# Patient Record
Sex: Female | Born: 1985
Health system: Southern US, Community
[De-identification: ages and names within clinical notes are randomized; demographics above are authoritative.]

## PROBLEM LIST (undated history)

## (undated) ENCOUNTER — Inpatient Hospital Stay: Payer: Self-pay

## (undated) DIAGNOSIS — F419 Anxiety disorder, unspecified: Secondary | ICD-10-CM

## (undated) DIAGNOSIS — E282 Polycystic ovarian syndrome: Secondary | ICD-10-CM

## (undated) HISTORY — DX: Anxiety disorder, unspecified: F41.9

## (undated) HISTORY — DX: Polycystic ovarian syndrome: E28.2

## (undated) HISTORY — PX: MOUTH SURGERY: SHX715

---
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
## 2007-06-05 DIAGNOSIS — E669 Obesity, unspecified: Secondary | ICD-10-CM | POA: Insufficient documentation

## 2007-12-26 ENCOUNTER — Emergency Department: Payer: Self-pay | Admitting: Emergency Medicine

## 2008-09-30 DIAGNOSIS — L709 Acne, unspecified: Secondary | ICD-10-CM | POA: Insufficient documentation

## 2009-01-06 DIAGNOSIS — J309 Allergic rhinitis, unspecified: Secondary | ICD-10-CM | POA: Insufficient documentation

## 2009-03-19 ENCOUNTER — Ambulatory Visit: Payer: Self-pay | Admitting: Otolaryngology

## 2010-04-11 DIAGNOSIS — F419 Anxiety disorder, unspecified: Secondary | ICD-10-CM

## 2010-04-11 HISTORY — DX: Anxiety disorder, unspecified: F41.9

## 2011-03-25 ENCOUNTER — Ambulatory Visit: Payer: Self-pay | Admitting: Family Medicine

## 2011-04-08 ENCOUNTER — Ambulatory Visit: Payer: Self-pay | Admitting: General Surgery

## 2011-04-12 HISTORY — PX: LAPAROSCOPIC CHOLECYSTECTOMY: SUR755

## 2011-04-13 LAB — PATHOLOGY REPORT

## 2012-04-11 HISTORY — PX: GALLBLADDER SURGERY: SHX652

## 2014-10-16 ENCOUNTER — Encounter: Payer: Self-pay | Admitting: Family Medicine

## 2014-10-16 ENCOUNTER — Ambulatory Visit (INDEPENDENT_AMBULATORY_CARE_PROVIDER_SITE_OTHER): Payer: BC Managed Care – PPO | Admitting: Family Medicine

## 2014-10-16 VITALS — BP 118/80 | HR 64 | Temp 98.5°F | Resp 16 | Wt 234.4 lb

## 2014-10-16 DIAGNOSIS — R519 Headache, unspecified: Secondary | ICD-10-CM | POA: Insufficient documentation

## 2014-10-16 DIAGNOSIS — B078 Other viral warts: Secondary | ICD-10-CM | POA: Insufficient documentation

## 2014-10-16 DIAGNOSIS — L980 Pyogenic granuloma: Secondary | ICD-10-CM | POA: Insufficient documentation

## 2014-10-16 DIAGNOSIS — F4320 Adjustment disorder, unspecified: Secondary | ICD-10-CM | POA: Insufficient documentation

## 2014-10-16 DIAGNOSIS — F4541 Pain disorder exclusively related to psychological factors: Secondary | ICD-10-CM | POA: Insufficient documentation

## 2014-10-16 DIAGNOSIS — F411 Generalized anxiety disorder: Secondary | ICD-10-CM | POA: Diagnosis not present

## 2014-10-16 DIAGNOSIS — Z8619 Personal history of other infectious and parasitic diseases: Secondary | ICD-10-CM | POA: Insufficient documentation

## 2014-10-16 DIAGNOSIS — R51 Headache: Secondary | ICD-10-CM

## 2014-10-16 DIAGNOSIS — F419 Anxiety disorder, unspecified: Secondary | ICD-10-CM | POA: Insufficient documentation

## 2014-10-16 DIAGNOSIS — F4542 Pain disorder with related psychological factors: Secondary | ICD-10-CM | POA: Insufficient documentation

## 2014-10-16 MED ORDER — SERTRALINE HCL 50 MG PO TABS: 50.0000 mg | ORAL_TABLET | Freq: Every day | ORAL | Status: DC

## 2014-10-16 MED ORDER — CLONAZEPAM 0.5 MG PO TABS: 0.5000 mg | ORAL_TABLET | Freq: Two times a day (BID) | ORAL | Status: DC | PRN

## 2014-10-16 NOTE — Patient Instructions (Signed)
Call me if you can't tolerate one of the medications.

## 2014-10-16 NOTE — Progress Notes (Signed)
Subjective:     Patient ID: Ashlee Wilson, female   DOB: 05/02/1985, 29 y.o.   MRN: [id number]  HPI  Chief Complaint  Patient presents with  . Anxiety    Patient presents for follow up. LOV 04/15/13 Anxiety Disorder: Feeling well since Christmas holiday. No medications now CESD-R 2/60 and GAD-7 score 0/21. Reasses in 6-12 months. Patient is back in office today with concerns of anxiety for the past several months she describes anxiety as an expectant dread and when  she has attacks she states she loses her breath. Anxiety triggered when driving car and fearful of future events or whats to come.   States she has been off medications for a while and had been coping with anxiety with regular gym visits or by going to sleep. States she is getting married in November and has been experiencing anxiety episodes daily. Wishes to get back on medication. Did not feel buspirone or Wellbutrin worked well for her in the past. Reports jaw clinching with Paxil. Denies depression.   Review of Systems  Constitutional:       Not self medicating with tobacco, alcohol, or recreational drugs.       Objective:   Physical Exam  Constitutional: She appears well-developed and well-nourished. No distress.  Psychiatric: She has a normal mood and affect.       Assessment:    1. Generalized anxiety disorder - sertraline (ZOLOFT) 50 MG tablet; Take 1 tablet (50 mg total) by mouth daily. Take 1/2 pill for the first 5 days  Dispense: 30 tablet; Refill: 3 - clonazePAM (KLONOPIN) 0.5 MG tablet; Take 1 tablet (0.5 mg total) by mouth 2 (two) times daily as needed for anxiety.  Dispense: 20 tablet; Refill: 1    Plan:    F/u in 2 weeks.

## 2014-10-30 ENCOUNTER — Ambulatory Visit (INDEPENDENT_AMBULATORY_CARE_PROVIDER_SITE_OTHER): Payer: BC Managed Care – PPO | Admitting: Family Medicine

## 2014-10-30 ENCOUNTER — Encounter: Payer: Self-pay | Admitting: Family Medicine

## 2014-10-30 VITALS — BP 106/78 | HR 74 | Temp 98.4°F | Resp 16 | Ht 64.0 in | Wt 235.8 lb

## 2014-10-30 DIAGNOSIS — F411 Generalized anxiety disorder: Secondary | ICD-10-CM

## 2014-10-30 NOTE — Progress Notes (Signed)
Subjective:     Patient ID: Ashlee Wilson, female   DOB: 02-08-1986, 29 y.o.   MRN: [id number]  HPI  Chief Complaint  Patient presents with  . Anxiety    patient presents today for follow up from 10/16/14. Patient was prescribed Zoloft  and Klonopin 0.5mg , patient reports symptoms have improved and she is tolerating medication well only complaint patient states that she is taking medication in AM and around 4-5pm she starts to feel anxious again.   Will take clonazepam in the evening to help sleep. States fiance and friends all say that she is less anxious.   Review of Systems  Constitutional: Activity change: continues wth regular gym exercise with a personal trainer.        Objective:   Physical Exam  Constitutional: She appears well-developed and well-nourished. No distress.  Psychiatric: She has a normal mood and affect.       Assessment:    1. Generalized anxiety disorder-improved     Plan:    phone f/u in two weeks. Discussed pushing up time of taking sertraline to later in the day. If not effective improving late day sx will increase the dose.

## 2014-10-30 NOTE — Patient Instructions (Addendum)
Phone f/u in two weeks. We will decide on dose of sertraline at that time. Continue with your lifestyle changes!

## 2015-04-08 ENCOUNTER — Encounter: Payer: Self-pay | Admitting: Family Medicine

## 2015-04-08 ENCOUNTER — Ambulatory Visit (INDEPENDENT_AMBULATORY_CARE_PROVIDER_SITE_OTHER): Payer: BC Managed Care – PPO | Admitting: Family Medicine

## 2015-04-08 VITALS — BP 112/78 | HR 108 | Temp 97.5°F | Resp 18 | Wt 242.0 lb

## 2015-04-08 DIAGNOSIS — L0591 Pilonidal cyst without abscess: Secondary | ICD-10-CM | POA: Diagnosis not present

## 2015-04-08 MED ORDER — CEFDINIR 300 MG PO CAPS: 300.0000 mg | ORAL_CAPSULE | Freq: Two times a day (BID) | ORAL | Status: DC

## 2015-04-08 NOTE — Progress Notes (Signed)
Subjective:     Patient ID: Ashlee Wilson, female   DOB: [birth year], 29 y.o.   MRN: [id number]  HPI  Chief Complaint  Patient presents with  . Tailbone Pain    No known injury, Started christmas eve then gotten worse overnight. Thinks it is a cyst. Does not see anything on skin but can feel something under the skin.  Denies injury or hx of MRSA. No drainage reported. Hurts with sitting. Accompanied by her husband today. Standing on presentation. Reports her menses are not due until 1/6 and they have been trying to get pregnant.   Review of Systems  Constitutional: Negative for fever and chills.       Objective:   Physical Exam  Constitutional: She appears well-developed and well-nourished. She appears distressed (moderate pain).  Skin:  Proximal buttock cleft with 2 cm area of swelling, mild erythema and tenderness. No pointing or drainage. Exquisitely tender to the touch.       Assessment:    1. Infected pilonidal cyst - cefdinir (OMNICEF) 300 MG capsule; Take 1 capsule (300 mg total) by mouth 2 (two) times daily.  Dispense: 14 capsule; Refill: 0    Plan:    Discussed bathtub soaks and use of Tylenol up to 3000 mg./day. Return in 2 days if not improving.

## 2015-04-08 NOTE — Patient Instructions (Signed)
Discussed use of bathtub soaks daily and tylenol up to 3000 mg./day. The area should be improving or coming to a head in the next 48 hours.

## 2015-04-10 ENCOUNTER — Ambulatory Visit (INDEPENDENT_AMBULATORY_CARE_PROVIDER_SITE_OTHER): Payer: BC Managed Care – PPO | Admitting: Family Medicine

## 2015-04-10 ENCOUNTER — Encounter: Payer: Self-pay | Admitting: Family Medicine

## 2015-04-10 VITALS — BP 128/74 | HR 84 | Temp 98.0°F | Resp 16

## 2015-04-10 DIAGNOSIS — L0591 Pilonidal cyst without abscess: Secondary | ICD-10-CM

## 2015-04-10 NOTE — Patient Instructions (Signed)
Continue bathtub soaks and antibiotic. If increased swelling, pain, or drainage report to Urgent Care over the holiday weekend.

## 2015-04-10 NOTE — Progress Notes (Signed)
Subjective:     Patient ID: Ashlee Wilson, female   DOB: [birth year], 29 y.o.   MRN: [id number]  HPI  Chief Complaint  Patient presents with  . Cyst    Pilonidal cyst recheck from 2 days ago. Pt is not much better, but is sitting today.  Denies drainage and has been getting relief from Tylenol. Soaking for 15 minutes in the bathtub daily. Adamantly does not wish I & D today.   Review of Systems  Constitutional: Negative for fever and chills.       Objective:   Physical Exam  Constitutional: She appears well-developed and well-nourished. No distress.  Skin:  Decreased swelling in her proximal buttock cleft. No pointing or drainage. Continues to have exquisite tenderness and induration in her buttock parallel to her buttock cleft.       Assessment:    1. Infected pilonidal cyst: mild improvement    Plan:    Will continue abx and soaks. Will report to Urgent Care if increased pain and swelling over the holiday weekend.

## 2015-04-17 ENCOUNTER — Encounter: Payer: Self-pay | Admitting: Family Medicine

## 2015-04-17 ENCOUNTER — Ambulatory Visit (INDEPENDENT_AMBULATORY_CARE_PROVIDER_SITE_OTHER): Payer: BC Managed Care – PPO | Admitting: Family Medicine

## 2015-04-17 VITALS — BP 112/84 | HR 78 | Temp 98.4°F | Resp 16 | Wt 246.4 lb

## 2015-04-17 DIAGNOSIS — N912 Amenorrhea, unspecified: Secondary | ICD-10-CM

## 2015-04-17 DIAGNOSIS — Z331 Pregnant state, incidental: Secondary | ICD-10-CM | POA: Diagnosis not present

## 2015-04-17 DIAGNOSIS — Z3490 Encounter for supervision of normal pregnancy, unspecified, unspecified trimester: Secondary | ICD-10-CM

## 2015-04-17 LAB — POCT URINE PREGNANCY: Preg Test, Ur: POSITIVE — AB

## 2015-04-17 NOTE — Progress Notes (Signed)
Subjective:     Patient ID: Ashlee Wilson, female   DOB: 1986/01/12, 30 y.o.   MRN: [id number]  HPI  Chief Complaint  Patient presents with  . Possible Pregnancy    Patient comes in office to confrim pregnancy, patient [place] and that she had not been using any form of protection. She states that they planned pregnancy and wants to continue [facility]  No longer on medication. This is her first pregnancy. First day of LMP 12/9. Accompanied by her husband.   Review of Systems  Skin:       States pilonidal cyst has spontaneously drained clear fluid from her prior o.v.       Objective:   Physical Exam  Constitutional: She appears well-developed and well-nourished. No distress.       Assessment:    1. Absence of menstruation - POCT urine pregnancy  2. Pregnancy: will self refer to [facility]. Has started Prenatal vitamins.    Plan:    [facility].

## 2015-04-17 NOTE — Patient Instructions (Signed)
Continue pre-natal vitamins. Avoid medications as directed by your gyn.

## 2015-05-25 ENCOUNTER — Encounter: Payer: Self-pay | Admitting: Family Medicine

## 2015-05-25 ENCOUNTER — Ambulatory Visit (INDEPENDENT_AMBULATORY_CARE_PROVIDER_SITE_OTHER): Payer: BC Managed Care – PPO | Admitting: Family Medicine

## 2015-05-25 VITALS — BP 110/58 | HR 98 | Temp 98.7°F | Resp 16 | Wt 260.0 lb

## 2015-05-25 DIAGNOSIS — Z20828 Contact with and (suspected) exposure to other viral communicable diseases: Secondary | ICD-10-CM

## 2015-05-25 DIAGNOSIS — J069 Acute upper respiratory infection, unspecified: Secondary | ICD-10-CM

## 2015-05-25 LAB — POC INFLUENZA A&B (BINAX/QUICKVUE)
INFLUENZA A, POC: NEGATIVE
Influenza B, POC: NEGATIVE

## 2015-05-25 NOTE — Patient Instructions (Signed)
Products like Claritin, Delsym, and saline nasal spray are generally considered safe in pregnancy. Check with your gyn regarding other products.

## 2015-05-25 NOTE — Progress Notes (Signed)
Subjective:     Patient ID: Ashlee Wilson, female   DOB: 1985-08-12, 30 y.o.   MRN: [id number]  HPI  Chief Complaint  Patient presents with  . URI    Patient reports that she has cough, nasal congestion, low grade fever, and sore throat. 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   Reports onset of sx 2 days ago. She is currently pregnant.   Review of Systems     Objective:   Physical Exam  Constitutional: She appears well-developed and well-nourished. No distress.  Ears: T.M's intact without inflammation Sinuses: non-tender Throat: tonsils absent Neck: no cervical adenopathy Lungs: clear     Assessment:    1. Upper respiratory infection  2. Exposure to influenza - POC Influenza A&B    Plan:    Discussed symptomatic rx. She will call her ob-gyn and see which preparations they approve.

## 2015-07-03 LAB — OB RESULTS CONSOLE RUBELLA ANTIBODY, IGM: Rubella: IMMUNE

## 2015-07-03 LAB — OB RESULTS CONSOLE VARICELLA ZOSTER ANTIBODY, IGG: Varicella: IMMUNE

## 2015-07-03 LAB — OB RESULTS CONSOLE HEPATITIS B SURFACE ANTIGEN: Hepatitis B Surface Ag: NEGATIVE

## 2015-08-01 ENCOUNTER — Ambulatory Visit (INDEPENDENT_AMBULATORY_CARE_PROVIDER_SITE_OTHER): Payer: BC Managed Care – PPO | Admitting: Family Medicine

## 2015-08-01 ENCOUNTER — Encounter: Payer: Self-pay | Admitting: Family Medicine

## 2015-08-01 VITALS — BP 110/78 | HR 98 | Temp 98.3°F | Resp 16 | Wt 271.0 lb

## 2015-08-01 DIAGNOSIS — J301 Allergic rhinitis due to pollen: Secondary | ICD-10-CM

## 2015-08-01 DIAGNOSIS — H6981 Other specified disorders of Eustachian tube, right ear: Secondary | ICD-10-CM | POA: Diagnosis not present

## 2015-08-01 NOTE — Progress Notes (Signed)
Patient ID: Ashlee Wilson, female   DOB: 02-24-1986, 30 y.o.   MRN: [id number]       Patient: Ashlee Wilson Female    DOB: 02-24-1986   30 y.o.   MRN: [id number] Visit Date: 08/01/2015  Today's Provider: Dortha Kernennis Chrismon, PA   Chief Complaint  Patient presents with  . Otitis Media   Subjective:    Sore Throat  Associated symptoms include ear pain.  Ear Pain: Patient presents with right ear pain.  Symptoms include throat itchy. Symptoms began 4 days ago and are gradually worsening since that time. Patient denies fever and nonproductive cough. Ear history:  previous ear infections as a child.  Patient Active Problem List   Diagnosis Date Noted  . Anxiety disorder 10/16/2014  . Allergic rhinitis 01/06/2009  . Acne 09/30/2008  . Adiposity 06/05/2007   Past Surgical History  Procedure Laterality Date  . Gallbladder surgery  2014   Family History  Problem Relation Age of Onset  . Asthma Mother   . Hyperlipidemia Father    Allergies  Allergen Reactions  . Benadryl [Diphenhydramine]   . Oxycodone-Acetaminophen   . Paroxetine   . Penicillins    Previous Medications   No medications on file    Review of Systems  Constitutional: Negative.   HENT: Positive for ear pain and postnasal drip. Negative for rhinorrhea, sneezing and sore throat.   Eyes: Negative.   Respiratory: Negative.   Cardiovascular: Negative.   Gastrointestinal: Negative.     Social History  Substance Use Topics  . Smoking status: Never Smoker   . Smokeless tobacco: Not on file  . Alcohol Use: No   Objective:   LMP 03/20/2015 ([redacted] weeks pregnant). BP 110/78 mmHg  Pulse 98  Temp(Src) 98.3 F (36.8 C)  Resp 16  Wt 271 lb (122.925 kg)  SpO2 98%    Physical Exam  Constitutional: She is oriented to person, place, and time. She appears well-developed and well-nourished. No distress.  HENT:  Head: Normocephalic and atraumatic.  Right Ear: Hearing and external ear normal.  Left Ear:  Hearing and external ear normal.  Mouth/Throat: Oropharynx is clear and moist.  Slightly reddened nasal membranes with clear drainage.  Eyes: Conjunctivae and lids are normal. Right eye exhibits no discharge. Left eye exhibits no discharge. No scleral icterus.  Cardiovascular: Normal rate and regular rhythm.   Pulmonary/Chest: Effort normal and breath sounds normal. No respiratory distress.  Abdominal: Soft. Bowel sounds are normal.  Musculoskeletal: Normal range of motion.  Lymphadenopathy:    She has no cervical adenopathy.  Neurological: She is alert and oriented to person, place, and time.  Skin: Skin is intact. No lesion and no rash noted.  Psychiatric: She has a normal mood and affect. Her speech is normal and behavior is normal. Thought content normal.      Assessment & Plan:     1. Eustachian tube dysfunction, right Recent onset with itching in posterior throat on the right. No drainage from ear and no fever. No sign of infection on exam. May use Zyrtec qd (Cat. B for pregnancy). Recheck prn.  2. Allergic rhinitis due to pollen Some irritation to ear and nose. May use Zyrtec and saline nasal spray. Don't recommend steroid nasal sprays being [redacted] weeks pregnant. Recheck prn.       Dortha Kernennis Chrismon, PA  [facility]

## 2015-09-30 LAB — OB RESULTS CONSOLE RPR: RPR: NONREACTIVE

## 2015-09-30 LAB — OB RESULTS CONSOLE HIV ANTIBODY (ROUTINE TESTING): HIV: NONREACTIVE

## 2015-11-30 LAB — OB RESULTS CONSOLE GBS: STREP GROUP B AG: NEGATIVE

## 2015-12-15 ENCOUNTER — Encounter: Payer: Self-pay | Admitting: *Deleted

## 2015-12-15 ENCOUNTER — Inpatient Hospital Stay
Admission: EM | Admit: 2015-12-15 | Discharge: 2015-12-15 | Disposition: A | Discharge status: Home / Self Care | Payer: BC Managed Care – PPO | Attending: Obstetrics & Gynecology | Admitting: Obstetrics & Gynecology

## 2015-12-15 DIAGNOSIS — Z3A38 38 weeks gestation of pregnancy: Secondary | ICD-10-CM | POA: Diagnosis not present

## 2015-12-15 NOTE — Discharge Instructions (Signed)
Call provider or return to birthplace with: ° °1. Strong regular contractions every 5 minutes. °2. Leaking of fluid from your vagina °3. Vaginal bleeding: Bright red or heavy like a period °4. Decreased Fetal movement ° °

## 2015-12-15 NOTE — Discharge Summary (Signed)
Physician Discharge Summary  Patient ID: Ashlee Wilson MRN: [id number] DOB/AGE: November 28, 1985 30 y.o.  Admit date: 12/15/2015 Discharge date: 12/15/2015  Admission Diagnoses: Pt sent from clinic this evening for prolonged monitoring due to decelerations seen on NST. Pt admits positive fetal movement. Pt denies contractions, LOF, VB  Discharge Diagnoses:  Active Problems:   Labor and delivery indication for care   IUP with Reactive NST  Discharged Condition: good  Hospital Course: Pt admitted for observation and placed on monitors  Consults: None  Significant Diagnostic Studies: None  Treatments: None  Discharge Exam: Height 5\' 4"  (1.626 m), weight 129.7 kg (286 lb), last menstrual period 03/20/2015. General appearance: alert, cooperative, appears stated age and no distress Resp: clear to auscultation bilaterally Cardio: regular rate and rhythm GI: gravid, mild to palpation Cervix: deferred  Toco: occasional contraction Fetal Well Being: 130 bpm baseline, moderate variability, + accelerations, - decelerations Category I tracing  Disposition: Discharge to home  Discharge Instructions    Discharge activity:  No Restrictions    Complete by:  As directed   Discharge diet:  No restrictions    Complete by:  As directed   Fetal Kick Count:  Lie on our left side for one hour after a meal, and count the number of times your baby kicks.  If it is less than 5 times, get up, move around and drink some juice.  Repeat the test 30 minutes later.  If it is still less than 5 kicks in an hour, notify your doctor.    Complete by:  As directed   LABOR:  When conractions begin, you should start to time them from the beginning of one contraction to the beginning  of the next.  When contractions are 5 - 10 minutes apart or less and have been regular for at least an hour, you should call your health care provider.    Complete by:  As directed   No sexual activity restrictions    Complete by:  As  directed   Notify physician for bleeding from the vagina    Complete by:  As directed   Notify physician for blurring of vision or spots before the eyes    Complete by:  As directed   Notify physician for chills or fever    Complete by:  As directed   Notify physician for fainting spells, "black outs" or loss of consciousness    Complete by:  As directed   Notify physician for increase in vaginal discharge    Complete by:  As directed   Notify physician for leaking of fluid    Complete by:  As directed   Notify physician for pain or burning when urinating    Complete by:  As directed   Notify physician for pelvic pressure (sudden increase)    Complete by:  As directed   Notify physician for severe or continued nausea or vomiting    Complete by:  As directed   Notify physician for sudden gushing of fluid from the vagina (with or without continued leaking)    Complete by:  As directed   Notify physician for sudden, constant, or occasional abdominal pain    Complete by:  As directed   Notify physician if baby moving less than usual    Complete by:  As directed       Medication List    TAKE these medications   calcium carbonate 500 MG chewable tablet Commonly known as:  TUMS - dosed in  mg elemental calcium Chew 2 tablets by mouth at bedtime as needed for indigestion or heartburn.      Follow-up Information    Letitia Libra, MD. Go on 12/21/2015.   Specialty:  Obstetrics and Gynecology Contact information: [address] [place] [address] [phone]           Signed: Obie Dredge 12/15/2015, 7:49 PM

## 2015-12-15 NOTE — OB Triage Note (Signed)
Dr Bonney AidStaebler called pt report in- stated that pt had NST in office this pm, with Baseline in 150's. Stated 3 decels viewed in beginning of strip to 120's. Pt for prolonged NST in BP

## 2016-01-01 ENCOUNTER — Emergency Department
Admission: EM | Admit: 2016-01-01 | Discharge: 2016-01-01 | Disposition: A | Discharge status: Home / Self Care | Payer: BC Managed Care – PPO

## 2016-01-02 ENCOUNTER — Inpatient Hospital Stay
Admit: 2016-01-02 | Discharge: 2016-01-07 | DRG: 765 | Disposition: A | Discharge status: Home / Self Care | Payer: BC Managed Care – PPO | Attending: Certified Nurse Midwife | Admitting: Certified Nurse Midwife

## 2016-01-02 DIAGNOSIS — O480 Post-term pregnancy: Secondary | ICD-10-CM | POA: Diagnosis present

## 2016-01-02 DIAGNOSIS — O99213 Obesity complicating pregnancy, third trimester: Secondary | ICD-10-CM | POA: Diagnosis present

## 2016-01-02 DIAGNOSIS — F419 Anxiety disorder, unspecified: Secondary | ICD-10-CM | POA: Diagnosis present

## 2016-01-02 DIAGNOSIS — Z6841 Body Mass Index (BMI) 40.0 and over, adult: Secondary | ICD-10-CM | POA: Diagnosis not present

## 2016-01-02 DIAGNOSIS — D62 Acute posthemorrhagic anemia: Secondary | ICD-10-CM | POA: Diagnosis present

## 2016-01-02 DIAGNOSIS — O0993 Supervision of high risk pregnancy, unspecified, third trimester: Secondary | ICD-10-CM

## 2016-01-02 DIAGNOSIS — O9902 Anemia complicating childbirth: Secondary | ICD-10-CM | POA: Diagnosis present

## 2016-01-02 DIAGNOSIS — Z3A41 41 weeks gestation of pregnancy: Secondary | ICD-10-CM

## 2016-01-02 DIAGNOSIS — Z23 Encounter for immunization: Secondary | ICD-10-CM

## 2016-01-02 DIAGNOSIS — Z87891 Personal history of nicotine dependence: Secondary | ICD-10-CM

## 2016-01-02 DIAGNOSIS — O99214 Obesity complicating childbirth: Secondary | ICD-10-CM | POA: Diagnosis present

## 2016-01-02 DIAGNOSIS — Z888 Allergy status to other drugs, medicaments and biological substances status: Secondary | ICD-10-CM | POA: Diagnosis not present

## 2016-01-02 DIAGNOSIS — Z880 Allergy status to penicillin: Secondary | ICD-10-CM

## 2016-01-02 DIAGNOSIS — Z98891 History of uterine scar from previous surgery: Secondary | ICD-10-CM

## 2016-01-02 LAB — CBC
HCT: 34.5 % — ABNORMAL LOW (ref 35.0–47.0)
HEMOGLOBIN: 11.9 g/dL — AB (ref 12.0–16.0)
MCH: 27.1 pg (ref 26.0–34.0)
MCHC: 34.4 g/dL (ref 32.0–36.0)
MCV: 78.8 fL — ABNORMAL LOW (ref 80.0–100.0)
Platelets: 202 10*3/uL (ref 150–440)
RBC: 4.38 MIL/uL (ref 3.80–5.20)
RDW: 16.8 % — ABNORMAL HIGH (ref 11.5–14.5)
WBC: 11.5 10*3/uL — AB (ref 3.6–11.0)

## 2016-01-02 LAB — TYPE AND SCREEN
ABO/RH(D): O POS
Antibody Screen: NEGATIVE

## 2016-01-02 LAB — OB RESULTS CONSOLE GC/CHLAMYDIA
Chlamydia: NEGATIVE
GC PROBE AMP, GENITAL: NEGATIVE

## 2016-01-02 LAB — CHLAMYDIA/NGC RT PCR (ARMC ONLY)
Chlamydia Tr: NOT DETECTED
N gonorrhoeae: NOT DETECTED

## 2016-01-02 MED ORDER — SODIUM CHLORIDE FLUSH 0.9 % IV SOLN
INTRAVENOUS | Status: AC
  Filled 2016-01-02: qty 10

## 2016-01-02 MED ORDER — DINOPROSTONE 10 MG VA INST
10.0000 mg | VAGINAL_INSERT | Freq: Once | VAGINAL | Status: AC
  Administered 2016-01-02: 10 mg via VAGINAL
  Filled 2016-01-02: qty 1

## 2016-01-02 MED ORDER — ONDANSETRON HCL 4 MG/2ML IJ SOLN
4.0000 mg | Freq: Four times a day (QID) | INTRAMUSCULAR | Status: DC | PRN
  Administered 2016-01-03 – 2016-01-04 (×3): 4 mg via INTRAVENOUS
  Filled 2016-01-02 (×3): qty 2

## 2016-01-02 MED ORDER — OXYTOCIN BOLUS FROM INFUSION: 500.0000 mL | Freq: Once | INTRAVENOUS | Status: DC

## 2016-01-02 MED ORDER — AMMONIA AROMATIC IN INHA
RESPIRATORY_TRACT | Status: AC
  Filled 2016-01-02: qty 10

## 2016-01-02 MED ORDER — LIDOCAINE HCL (PF) 1 % IJ SOLN
INTRAMUSCULAR | Status: AC
  Filled 2016-01-02: qty 30

## 2016-01-02 MED ORDER — SOD CITRATE-CITRIC ACID 500-334 MG/5ML PO SOLN
30.0000 mL | ORAL | Status: DC | PRN
  Administered 2016-01-04: 30 mL via ORAL

## 2016-01-02 MED ORDER — LACTATED RINGERS IV SOLN
500.0000 mL | INTRAVENOUS | Status: DC | PRN
  Administered 2016-01-04 (×2): via INTRAVENOUS

## 2016-01-02 MED ORDER — OXYTOCIN 40 UNITS IN LACTATED RINGERS INFUSION - SIMPLE MED
2.5000 [IU]/h | INTRAVENOUS | Status: DC
  Administered 2016-01-04: 399 mL via INTRAVENOUS
  Administered 2016-01-04: 1 mL via INTRAVENOUS
  Filled 2016-01-02 (×3): qty 1000

## 2016-01-02 MED ORDER — LIDOCAINE HCL (PF) 1 % IJ SOLN
30.0000 mL | INTRAMUSCULAR | Status: DC | PRN
Start: 2016-01-02 — End: 2016-01-04

## 2016-01-02 MED ORDER — OXYTOCIN 10 UNIT/ML IJ SOLN
INTRAMUSCULAR | Status: AC
  Filled 2016-01-02: qty 2

## 2016-01-02 MED ORDER — OXYTOCIN 10 UNIT/ML IJ SOLN: 10.0000 [IU] | Freq: Once | INTRAMUSCULAR | Status: DC

## 2016-01-02 MED ORDER — MISOPROSTOL 25 MCG QUARTER TABLET
25.0000 ug | ORAL_TABLET | ORAL | Status: AC
  Administered 2016-01-02 – 2016-01-03 (×2): 25 ug via VAGINAL
  Filled 2016-01-02 (×2): qty 0.25
  Filled 2016-01-02: qty 1

## 2016-01-02 MED ORDER — TERBUTALINE SULFATE 1 MG/ML IJ SOLN: 0.2500 mg | Freq: Once | INTRAMUSCULAR | Status: DC | PRN

## 2016-01-02 MED ORDER — MISOPROSTOL 200 MCG PO TABS
ORAL_TABLET | ORAL | Status: AC
  Administered 2016-01-02: 25 ug via VAGINAL
  Filled 2016-01-02: qty 4

## 2016-01-02 MED ORDER — LACTATED RINGERS IV SOLN
INTRAVENOUS | Status: DC
  Administered 2016-01-02 – 2016-01-03 (×3): via INTRAVENOUS

## 2016-01-02 NOTE — H&P (Signed)
OB History & Physical   History of Present Illness:  Chief Complaint: induction of labor for dates  HPI:  Ashlee Wilson is a 30 y.o. G1P0 female at [redacted]w[redacted]d dated by 9 weeks ultrasound.  Her pregnancy has been complicated by obesity with initial BMI of about 46 with a 44 pound weight gain and final BMI > 50.    She denies contractions.   She denies leakage of fluid.   She denies vaginal bleeding.   She reports fetal movement.    Maternal Medical History:   Past Medical History:  Diagnosis Date  . Anxiety 2012   non longer on meds    Past Surgical History:  Procedure Laterality Date  . GALLBLADDER SURGERY  2014  . LAPAROSCOPIC CHOLECYSTECTOMY N/A 2013  . MOUTH SURGERY N/A 2003-07   lower teeth removed, manually pulled adult teeth up over several years    Allergies  Allergen Reactions  . Benadryl [Diphenhydramine] Other (See Comments)    HR slowed, and BP dropped  . Oxycodone-Acetaminophen Other (See Comments)    Pt expressed that she received for mouth sx years ago- pain med was such- and pt became incapcitated  . Paroxetine   . Penicillins Other (See Comments)    Pt states occurred as a baby, does not know reaction, but avoids taking such    Prior to Admission medications   Medication Sig Start Date End Date Taking? Authorizing Provider  calcium carbonate (TUMS - DOSED IN MG ELEMENTAL CALCIUM) 500 MG chewable tablet Chew 2 tablets by mouth at bedtime as needed for indigestion or heartburn.   Yes Historical Provider, MD  oxymetazoline (AFRIN) 0.05 % nasal spray Place 1 spray into both nostrils 2 (two) times daily.   Yes Historical Provider, MD    OB History  Gravida Para Term Preterm AB Living  1            SAB TAB Ectopic Multiple Live Births               # Outcome Date GA Lbr Len/2nd Weight Sex Delivery Anes PTL Lv  1 Current               Prenatal care site: [facility]  Social History: She  reports that she quit smoking about 8 years ago. She has never  used smokeless tobacco. She reports that she does not drink alcohol or use drugs.  Family History: family history includes Asthma in her mother; Hyperlipidemia in her father; Prostate cancer in her father.   Review of Systems: Negative x 10 systems reviewed except as noted in the HPI.    Physical Exam:  Vital Signs: BP 132/83 (BP Location: Left Arm)   Pulse (!) 113   Temp 98.2 F (36.8 C) (Oral)   Resp 19   Ht 5\' 4"  (1.626 m)   Wt 287 lb (130.2 kg)   LMP 03/20/2015   BMI 49.26 kg/m  General: no acute distress.  HEENT: normocephalic, atraumatic Heart: regular rate & rhythm.  No murmurs/rubs/gallops Lungs: clear to auscultation bilaterally Abdomen: soft, gravid, non-tender;  EFW: 8 pounds Pelvic: (female chaperone present during pelvic exam)  External: Normal external female genitalia  Cervix: 0.5 / 30 / -3 / posterior / medium  Extremities: non-tender, symmetric, no edema bilaterally.  DTRs: 2+  Neurologic: Alert & oriented x 3.    Pertinent Results:  Prenatal Labs: Blood type/Rh O positive  Antibody screen negative  Rubella Immune  Varicella Immune    RPR NR  HBsAg negative  HIV negative  GC Not done  Chlamydia Not done  Genetic screening 1st trim neg,  1 hour GTT Early 1h gtt 105, 28 wk 1h gtt 127  3 hour GTT n/a  GBS negative on 11/30/15   Baseline FHR: 135 beats/min   Variability: moderate   Accelerations: present   Decelerations: absent Contractions: present frequency: infrequent Overall assessment: category 1  Assessment:  Ashlee Wilson is a 30 y.o. G1P0 female at 2264w1d with IOL for post dates, Obesity with BMI > 50.  Total weight gain this pregnancy is about 44 pounds.  Last growth u/s on 8/21 was 3087 grams (6 lb 13 oz) (61.5th%ile).     Plan:  1. Admit to Labor & Delivery  2. CBC, T&S, Clrs, IVF 3. GBS negative.   4. Fetwal well-being: reassuring 5. IOL with cervidil.    Conard NovakJackson, Cleotha Whalin D, MD 01/02/2016 7:13 AM

## 2016-01-03 ENCOUNTER — Inpatient Hospital Stay: Payer: BC Managed Care – PPO | Admitting: Anesthesiology

## 2016-01-03 ENCOUNTER — Encounter: Payer: Self-pay | Admitting: Anesthesiology

## 2016-01-03 LAB — RPR: RPR: NONREACTIVE

## 2016-01-03 LAB — PLATELET COUNT: Platelets: 187 10*3/uL (ref 150–440)

## 2016-01-03 MED ORDER — NALOXONE HCL 2 MG/2ML IJ SOSY
1.0000 ug/kg/h | PREFILLED_SYRINGE | INTRAMUSCULAR | Status: DC | PRN
  Filled 2016-01-03: qty 2

## 2016-01-03 MED ORDER — FENTANYL 2.5 MCG/ML W/ROPIVACAINE 0.2% IN NS 100 ML EPIDURAL INFUSION (ARMC-ANES)
10.0000 mL/h | EPIDURAL | Status: DC
  Administered 2016-01-03 – 2016-01-04 (×2): 10 mL/h via EPIDURAL
  Filled 2016-01-03 (×2): qty 100

## 2016-01-03 MED ORDER — LIDOCAINE HCL (PF) 1 % IJ SOLN
INTRAMUSCULAR | Status: DC | PRN
  Administered 2016-01-03 – 2016-01-04 (×3): 1 mL via INTRADERMAL

## 2016-01-03 MED ORDER — FENTANYL 2.5 MCG/ML W/ROPIVACAINE 0.2% IN NS 100 ML EPIDURAL INFUSION (ARMC-ANES)
EPIDURAL | Status: AC
  Filled 2016-01-03: qty 100

## 2016-01-03 MED ORDER — SODIUM CHLORIDE 0.9 % IV SOLN
INTRAVENOUS | Status: DC | PRN
  Administered 2016-01-03: 21:00:00 via EPIDURAL
  Administered 2016-01-03 – 2016-01-04 (×6): 5 mL via EPIDURAL

## 2016-01-03 MED ORDER — LIDOCAINE-EPINEPHRINE (PF) 1.5 %-1:200000 IJ SOLN
INTRAMUSCULAR | Status: DC | PRN
  Administered 2016-01-03 – 2016-01-04 (×3): 3 mL via EPIDURAL

## 2016-01-03 MED ORDER — BUTORPHANOL TARTRATE 1 MG/ML IJ SOLN
INTRAMUSCULAR | Status: AC
  Administered 2016-01-03: 1 mg via INTRAVENOUS
  Filled 2016-01-03: qty 1

## 2016-01-03 MED ORDER — SODIUM CHLORIDE 0.9% FLUSH
3.0000 mL | INTRAVENOUS | Status: DC | PRN
Start: 2016-01-03 — End: 2016-01-04

## 2016-01-03 MED ORDER — BUTORPHANOL TARTRATE 1 MG/ML IJ SOLN
2.0000 mg | INTRAMUSCULAR | Status: DC | PRN
  Administered 2016-01-03: 2 mg via INTRAVENOUS
  Administered 2016-01-03 (×7): 1 mg via INTRAVENOUS
  Filled 2016-01-03: qty 1
  Filled 2016-01-03 (×2): qty 2
  Filled 2016-01-03 (×3): qty 1
  Filled 2016-01-03: qty 2

## 2016-01-03 MED ORDER — NALOXONE HCL 0.4 MG/ML IJ SOLN: 0.4000 mg | INTRAMUSCULAR | Status: DC | PRN

## 2016-01-03 MED ORDER — SODIUM CHLORIDE 0.9 % IJ SOLN
INTRAMUSCULAR | Status: AC
  Filled 2016-01-03: qty 50

## 2016-01-03 NOTE — Anesthesia Procedure Notes (Addendum)
Epidural Patient location during procedure: OB Start time: 01/03/2016 8:40 PM End time: 01/03/2016 8:49 PM  Staffing Anesthesiologist: Margorie JohnPISCITELLO, Karl Erway K Performed: anesthesiologist   Preanesthetic Checklist Completed: patient identified, site marked, surgical consent, pre-op evaluation, timeout performed, IV checked, risks and benefits discussed and monitors and equipment checked  Epidural Patient position: sitting Prep: Betadine Patient monitoring: heart rate, continuous pulse ox and blood pressure Approach: midline Location: L3-L4 Injection technique: LOR saline  Needle:  Needle type: Tuohy  Needle gauge: 17 G Needle length: 9 cm and 9 Needle insertion depth: 10.5 cm Catheter type: closed end flexible Catheter size: 19 Gauge Catheter at skin depth: 15.5 cm Test dose: negative and 1.5% lidocaine with Epi 1:200 K  Assessment Events: blood not aspirated, injection not painful, no injection resistance, negative IV test and no paresthesia  Additional Notes Pt. Evaluated and documentation done after procedure finished. Patient identified. Risks/Benefits/Options discussed with patient including but not limited to bleeding, infection, nerve damage, paralysis, failed block, incomplete pain control, headache, blood pressure changes, nausea, vomiting, reactions to medication both or allergic, itching and postpartum back pain. Confirmed with bedside nurse the patient's most recent platelet count. Confirmed with patient that they are not currently taking any anticoagulation, have any bleeding history or any family history of bleeding disorders. Patient expressed understanding and wished to proceed. All questions were answered. Sterile technique was used throughout the entire procedure. Please see nursing notes for vital signs. Test dose was given through epidural catheter and negative prior to continuing to dose epidural or start infusion. Warning signs of high block given to the patient  including shortness of breath, tingling/numbness in hands, complete motor block, or any concerning symptoms with instructions to call for help. Patient was given instructions on fall risk and not to get out of bed. All questions and concerns addressed with instructions to call with any issues or inadequate analgesia.   Patient tolerated the insertion well without immediate complications.Reason for block:procedure for pain

## 2016-01-03 NOTE — Progress Notes (Signed)
  Labor Progress Note   30 y.o. G1P0 @ 5437w2d , admitted for  Pregnancy, Labor Management. IOL for obesity/postdates  Subjective:  Pt is feeling contractions and so far is doing well with IV stadol  Objective:  BP 134/88 (BP Location: Right Arm)   Pulse 94   Temp 98.3 F (36.8 C) (Oral)   Resp 14   Ht 5\' 4"  (1.626 m)   Wt 287 lb (130.2 kg)   LMP 03/20/2015   BMI 49.26 kg/m  Abd: mild Extr: trace to 1+ bilateral pedal edema SVE: CERVIX: 2 cm dilated, 50 effaced, -3 station  EFM: FHR: 120 bpm, variability: moderate,  accelerations:  Present,  decelerations:  Absent Toco: Frequency: Every 1-3 minutes Labs: I have reviewed the patient's lab results.   Assessment & Plan:  G1P0 @ 2137w2d, admitted for  Pregnancy and Labor/Delivery Management  1. Pain management: IV analgesia. 2. FWB: FHT category I.  3. ID: GBS negative 4. Labor management: foley bulb placed All discussed with patient, see orders   Toree Edling, CNM

## 2016-01-03 NOTE — Anesthesia Procedure Notes (Signed)
Epidural Patient location during procedure: OB Start time: 01/03/2016 10:04 PM End time: 01/03/2016 10:17 PM  Staffing Anesthesiologist: Margorie JohnPISCITELLO, JOSEPH K Performed: anesthesiologist   Preanesthetic Checklist Completed: patient identified, site marked, surgical consent, pre-op evaluation, timeout performed, IV checked, risks and benefits discussed and monitors and equipment checked  Epidural Patient position: sitting Prep: Betadine Patient monitoring: heart rate, continuous pulse ox and blood pressure Approach: midline Location: L3-L4 Injection technique: LOR saline  Needle:  Needle type: Tuohy  Needle gauge: 18 G Needle length: 15 cm and 9 Needle insertion depth: 11 cm Catheter type: closed end flexible Catheter size: 20 Guage Catheter at skin depth: 16 cm Test dose: negative and 1.5% lidocaine with Epi 1:200 K  Assessment Sensory level: T10 Events: blood not aspirated, injection not painful, no injection resistance, negative IV test and no paresthesia  Additional Notes Pt. Evaluated and documentation done after procedure finished. Patient identified. Risks/Benefits/Options discussed with patient including but not limited to bleeding, infection, nerve damage, paralysis, failed block, incomplete pain control, headache, blood pressure changes, nausea, vomiting, reactions to medication both or allergic, itching and postpartum back pain. Confirmed with bedside nurse the patient's most recent platelet count. Confirmed with patient that they are not currently taking any anticoagulation, have any bleeding history or any family history of bleeding disorders. Patient expressed understanding and wished to proceed. All questions were answered. Sterile technique was used throughout the entire procedure. Please see nursing notes for vital signs. Test dose was given through epidural catheter and negative prior to continuing to dose epidural or start infusion. Warning signs of high block given  to the patient including shortness of breath, tingling/numbness in hands, complete motor block, or any concerning symptoms with instructions to call for help. Patient was given instructions on fall risk and not to get out of bed. All questions and concerns addressed with instructions to call with any issues or inadequate analgesia.   Patient tolerated the insertion well without complications.  Reason for block:procedure for pain

## 2016-01-03 NOTE — Progress Notes (Signed)
Pt is complaining of pain with contractions and is requesting IV pain medicine. She states her allergy to opioid medication is not severe and is based on a reaction she had as a teenager which made her mentally incapacitated. She is ok with trying Stadol.   She is status post 2 doses of cytotec and contracting q 1-3 minutes. Contractions palpate mild and they last for 20-50 seconds. Dose number 3 is held at this time based on frequency of contractions. Her cervix is 2/30/-3 per her RN. Baseline FHR is 125 with moderate variability and +accelerations/-decelerations.  IOL Plan: 1. Hold dose of cytotec for tachysystole 2. Attempt foley bulb at next cervical check   Cashel Bellina, CNM

## 2016-01-03 NOTE — Anesthesia Preprocedure Evaluation (Addendum)
Anesthesia Evaluation  Patient identified by MRN, date of birth, ID band Patient awake    Reviewed: Allergy & Precautions, H&P , NPO status , Patient's Chart, lab work & pertinent test results  History of Anesthesia Complications Negative for: history of anesthetic complications  Airway Mallampati: III  TM Distance: >3 FB Neck ROM: full    Dental  (+) Poor Dentition   Pulmonary former smoker,    Pulmonary exam normal breath sounds clear to auscultation       Cardiovascular Exercise Tolerance: Good (-) hypertensionnegative cardio ROS Normal cardiovascular exam Rhythm:regular Rate:Normal     Neuro/Psych    GI/Hepatic negative GI ROS,   Endo/Other  Morbid obesity  Renal/GU   negative genitourinary   Musculoskeletal   Abdominal   Peds  Hematology negative hematology ROS (+)   Anesthesia Other Findings Patient reports baseline back pain pre epidural placement that she feels is from her labor She also reports that her abdomen has been "numb" for the past 2-3 months  Past Medical History: 2012: Anxiety     Comment: non longer on meds  Past Surgical History: 2014: GALLBLADDER SURGERY 2013: LAPAROSCOPIC CHOLECYSTECTOMY N/A 2003-07: MOUTH SURGERY N/A     Comment: lower teeth removed, manually pulled adult               teeth up over several years  BMI    Body Mass Index:  49.26 kg/m      Reproductive/Obstetrics (+) Pregnancy                            Anesthesia Physical Anesthesia Plan  ASA: III  Anesthesia Plan: Epidural   Post-op Pain Management:    Induction:   Airway Management Planned:   Additional Equipment:   Intra-op Plan:   Post-operative Plan:   Informed Consent: I have reviewed the patients History and Physical, chart, labs and discussed the procedure including the risks, benefits and alternatives for the proposed anesthesia with the patient or authorized  representative who has indicated his/her understanding and acceptance.     Plan Discussed with: Anesthesiologist  Anesthesia Plan Comments:         Anesthesia Quick Evaluation

## 2016-01-04 ENCOUNTER — Encounter: Disposition: A | Discharge status: Home / Self Care | Payer: Self-pay | Attending: Certified Nurse Midwife

## 2016-01-04 ENCOUNTER — Encounter: Payer: Self-pay | Admitting: *Deleted

## 2016-01-04 DIAGNOSIS — Z98891 History of uterine scar from previous surgery: Secondary | ICD-10-CM

## 2016-01-04 LAB — CBC
HEMATOCRIT: 33.7 % — AB (ref 35.0–47.0)
Hemoglobin: 11.7 g/dL — ABNORMAL LOW (ref 12.0–16.0)
MCH: 27 pg (ref 26.0–34.0)
MCHC: 34.7 g/dL (ref 32.0–36.0)
MCV: 77.8 fL — AB (ref 80.0–100.0)
Platelets: 192 10*3/uL (ref 150–440)
RBC: 4.33 MIL/uL (ref 3.80–5.20)
RDW: 17 % — AB (ref 11.5–14.5)
WBC: 16.6 10*3/uL — ABNORMAL HIGH (ref 3.6–11.0)

## 2016-01-04 SURGERY — Surgical Case
Anesthesia: Choice

## 2016-01-04 MED ORDER — DEXTROSE IN LACTATED RINGERS 5 % IV SOLN
INTRAVENOUS | Status: DC
  Administered 2016-01-04: 125 mL/h via INTRAVENOUS

## 2016-01-04 MED ORDER — NALBUPHINE HCL 10 MG/ML IJ SOLN: 5.0000 mg | Freq: Once | INTRAMUSCULAR | Status: DC | PRN

## 2016-01-04 MED ORDER — BUPIVACAINE 0.25 % ON-Q PUMP DUAL CATH 400 ML
INJECTION | Status: AC
  Filled 2016-01-04: qty 400

## 2016-01-04 MED ORDER — BUPIVACAINE HCL (PF) 0.5 % IJ SOLN
INTRAMUSCULAR | Status: AC
  Filled 2016-01-04: qty 30

## 2016-01-04 MED ORDER — HYDROCODONE-ACETAMINOPHEN 5-325 MG PO TABS: 2.0000 | ORAL_TABLET | ORAL | Status: DC | PRN

## 2016-01-04 MED ORDER — MORPHINE SULFATE (PF) 0.5 MG/ML IJ SOLN
INTRAMUSCULAR | Status: DC | PRN
  Administered 2016-01-04: .1 mg via INTRATHECAL

## 2016-01-04 MED ORDER — DEXTROSE 5 % IV SOLN
INTRAVENOUS | Status: DC | PRN
  Administered 2016-01-04: 3 g via INTRAVENOUS

## 2016-01-04 MED ORDER — EPHEDRINE SULFATE 50 MG/ML IJ SOLN
INTRAMUSCULAR | Status: DC | PRN
  Administered 2016-01-04: 10 mg via INTRAVENOUS

## 2016-01-04 MED ORDER — NALBUPHINE HCL 10 MG/ML IJ SOLN: 5.0000 mg | INTRAMUSCULAR | Status: DC | PRN

## 2016-01-04 MED ORDER — COCONUT OIL OIL
1.0000 "application " | TOPICAL_OIL | Status: DC | PRN
  Filled 2016-01-04: qty 120

## 2016-01-04 MED ORDER — HYDROCODONE-ACETAMINOPHEN 5-325 MG PO TABS: 1.0000 | ORAL_TABLET | ORAL | Status: DC | PRN

## 2016-01-04 MED ORDER — PRENATAL MULTIVITAMIN CH
1.0000 | ORAL_TABLET | Freq: Every day | ORAL | Status: DC
  Administered 2016-01-05 – 2016-01-07 (×3): 1 via ORAL
  Filled 2016-01-04 (×3): qty 1

## 2016-01-04 MED ORDER — BUPIVACAINE ON-Q PAIN PUMP (FOR ORDER SET NO CHG)
INJECTION | Status: DC
  Filled 2016-01-04: qty 1

## 2016-01-04 MED ORDER — PHENYLEPHRINE HCL 10 MG/ML IJ SOLN
INTRAMUSCULAR | Status: DC | PRN
  Administered 2016-01-04 (×3): 100 ug via INTRAVENOUS

## 2016-01-04 MED ORDER — FENTANYL CITRATE (PF) 100 MCG/2ML IJ SOLN
50.0000 ug | INTRAMUSCULAR | Status: DC | PRN
  Administered 2016-01-04: 100 ug via INTRAVENOUS
  Filled 2016-01-04: qty 2

## 2016-01-04 MED ORDER — ONDANSETRON HCL 4 MG/2ML IJ SOLN
4.0000 mg | INTRAMUSCULAR | Status: DC | PRN
  Administered 2016-01-04: 4 mg via INTRAVENOUS
  Filled 2016-01-04: qty 2

## 2016-01-04 MED ORDER — OXYTOCIN 40 UNITS IN LACTATED RINGERS INFUSION - SIMPLE MED: 2.5000 [IU]/h | INTRAVENOUS | Status: DC

## 2016-01-04 MED ORDER — IBUPROFEN 600 MG PO TABS: 600.0000 mg | ORAL_TABLET | Freq: Four times a day (QID) | ORAL | Status: DC

## 2016-01-04 MED ORDER — DIBUCAINE 1 % RE OINT: 1.0000 "application " | TOPICAL_OINTMENT | RECTAL | Status: DC | PRN

## 2016-01-04 MED ORDER — SODIUM CHLORIDE FLUSH 0.9 % IV SOLN
INTRAVENOUS | Status: AC
  Filled 2016-01-04: qty 10

## 2016-01-04 MED ORDER — MENTHOL 3 MG MT LOZG
1.0000 | LOZENGE | OROMUCOSAL | Status: DC | PRN
  Filled 2016-01-04: qty 9

## 2016-01-04 MED ORDER — LACTATED RINGERS IV SOLN
INTRAVENOUS | Status: DC
  Administered 2016-01-05: 11:00:00 via INTRAVENOUS

## 2016-01-04 MED ORDER — LIDOCAINE HCL (PF) 2 % IJ SOLN
INTRAMUSCULAR | Status: DC | PRN
  Administered 2016-01-04: 8 mL via INTRADERMAL

## 2016-01-04 MED ORDER — FENTANYL CITRATE (PF) 100 MCG/2ML IJ SOLN
INTRAMUSCULAR | Status: DC | PRN
  Administered 2016-01-04: 130.2 ug via INTRATHECAL

## 2016-01-04 MED ORDER — OXYTOCIN 40 UNITS IN LACTATED RINGERS INFUSION - SIMPLE MED
1.0000 m[IU]/min | INTRAVENOUS | Status: DC
  Administered 2016-01-04: 1 m[IU]/min via INTRAVENOUS
  Administered 2016-01-04: 7 m[IU]/min via INTRAVENOUS

## 2016-01-04 MED ORDER — SENNOSIDES-DOCUSATE SODIUM 8.6-50 MG PO TABS
2.0000 | ORAL_TABLET | ORAL | Status: DC
  Administered 2016-01-06 (×2): 2 via ORAL
  Filled 2016-01-04 (×2): qty 2

## 2016-01-04 MED ORDER — FERROUS SULFATE 325 (65 FE) MG PO TABS
325.0000 mg | ORAL_TABLET | Freq: Two times a day (BID) | ORAL | Status: DC
  Administered 2016-01-05 – 2016-01-07 (×5): 325 mg via ORAL
  Filled 2016-01-04 (×5): qty 1

## 2016-01-04 MED ORDER — TERBUTALINE SULFATE 1 MG/ML IJ SOLN: 0.2500 mg | Freq: Once | INTRAMUSCULAR | Status: DC | PRN

## 2016-01-04 MED ORDER — BUPIVACAINE HCL 0.5 % IJ SOLN
INTRAMUSCULAR | Status: DC | PRN
Start: 2016-01-04 — End: 2016-01-04
  Administered 2016-01-04: 10 mL

## 2016-01-04 MED ORDER — DEXTROSE 5 % IV SOLN
3.0000 g | INTRAVENOUS | Status: DC
  Filled 2016-01-04: qty 3000

## 2016-01-04 MED ORDER — SIMETHICONE 80 MG PO CHEW
80.0000 mg | CHEWABLE_TABLET | Freq: Three times a day (TID) | ORAL | Status: DC
  Administered 2016-01-05 – 2016-01-07 (×6): 80 mg via ORAL
  Filled 2016-01-04 (×6): qty 1

## 2016-01-04 MED ORDER — SOD CITRATE-CITRIC ACID 500-334 MG/5ML PO SOLN
ORAL | Status: AC
  Administered 2016-01-04: 30 mL via ORAL
  Filled 2016-01-04: qty 15

## 2016-01-04 MED ORDER — BUPIVACAINE IN DEXTROSE 0.75-8.25 % IT SOLN
INTRATHECAL | Status: DC | PRN
Start: 2016-01-04 — End: 2016-01-04
  Administered 2016-01-04: 1.6 mL via INTRATHECAL

## 2016-01-04 MED ORDER — DEXTROSE 5 % IV SOLN
500.0000 mg | INTRAVENOUS | Status: AC
  Administered 2016-01-04: 500 mg via INTRAVENOUS
  Filled 2016-01-04: qty 500

## 2016-01-04 MED ORDER — CEFAZOLIN IN D5W 1 GM/50ML IV SOLN
INTRAVENOUS | Status: AC
  Administered 2016-01-04: 1 g
  Filled 2016-01-04: qty 50

## 2016-01-04 MED ORDER — WITCH HAZEL-GLYCERIN EX PADS: 1.0000 "application " | MEDICATED_PAD | CUTANEOUS | Status: DC | PRN

## 2016-01-04 MED ORDER — CEFAZOLIN SODIUM-DEXTROSE 2-4 GM/100ML-% IV SOLN
INTRAVENOUS | Status: AC
  Administered 2016-01-04: 2000 mg
  Filled 2016-01-04: qty 100

## 2016-01-04 SURGICAL SUPPLY — 35 items
CANISTER SUCT 3000ML (MISCELLANEOUS) ×2 IMPLANT
CATH KIT ON-Q SILVERSOAK 5 (CATHETERS) ×2 IMPLANT
CATH KIT ON-Q SILVERSOAK 5IN (CATHETERS) ×4 IMPLANT
DRSG OPSITE POSTOP 4X10 (GAUZE/BANDAGES/DRESSINGS) ×2 IMPLANT
DRSG OPSITE POSTOP 4X14 (GAUZE/BANDAGES/DRESSINGS) ×1 IMPLANT
DRSG TELFA 3X8 NADH (GAUZE/BANDAGES/DRESSINGS) IMPLANT
ELECT CAUTERY BLADE 6.4 (BLADE) ×2 IMPLANT
ELECT REM PT RETURN 9FT ADLT (ELECTROSURGICAL) ×2
ELECTRODE REM PT RTRN 9FT ADLT (ELECTROSURGICAL) ×1 IMPLANT
GAUZE SPONGE 4X4 12PLY STRL (GAUZE/BANDAGES/DRESSINGS) ×1 IMPLANT
GLOVE BIO SURGEON STRL SZ7 (GLOVE) ×4 IMPLANT
GLOVE INDICATOR 7.5 STRL GRN (GLOVE) ×4 IMPLANT
GOWN STRL REUS W/ TWL LRG LVL3 (GOWN DISPOSABLE) ×3 IMPLANT
GOWN STRL REUS W/TWL LRG LVL3 (GOWN DISPOSABLE) ×6
KIT PREVENA INCISION MGT 13 (CANNISTER) ×1 IMPLANT
LIQUID BAND (GAUZE/BANDAGES/DRESSINGS) ×2 IMPLANT
NS IRRIG 1000ML POUR BTL (IV SOLUTION) ×2 IMPLANT
PACK C SECTION AR (MISCELLANEOUS) ×2 IMPLANT
PAD DRESSING TELFA 3X8 NADH (GAUZE/BANDAGES/DRESSINGS) ×1 IMPLANT
PAD OB MATERNITY 4.3X12.25 (PERSONAL CARE ITEMS) ×3 IMPLANT
PAD PREP 24X41 OB/GYN DISP (PERSONAL CARE ITEMS) ×2 IMPLANT
RETRACTOR TRAXI PANNICULUS (MISCELLANEOUS) IMPLANT
RTRCTR C-SECT PINK 34CM XLRG (MISCELLANEOUS) ×1 IMPLANT
SPONGE LAP 18X18 5 PK (GAUZE/BANDAGES/DRESSINGS) ×4 IMPLANT
STRIP CLOSURE SKIN 1/2X4 (GAUZE/BANDAGES/DRESSINGS) ×1 IMPLANT
SUT CHROMIC GUT BROWN 0 54 (SUTURE) ×1 IMPLANT
SUT CHROMIC GUT BROWN 0 54IN (SUTURE) ×2
SUT MNCRL 4-0 (SUTURE) ×2
SUT MNCRL 4-0 27XMFL (SUTURE) ×1
SUT PDS AB 1 TP1 96 (SUTURE) ×2 IMPLANT
SUT PLAIN 2 0 XLH (SUTURE) ×2 IMPLANT
SUT VIC AB 0 CT1 36 (SUTURE) ×8 IMPLANT
SUTURE MNCRL 4-0 27XMF (SUTURE) ×1 IMPLANT
SWABSTK COMLB BENZOIN TINCTURE (MISCELLANEOUS) ×1 IMPLANT
TRAXI PANNICULUS RETRACTOR (MISCELLANEOUS) ×1

## 2016-01-04 NOTE — Anesthesia Procedure Notes (Signed)
Epidural Patient location during procedure: OB Start time: 01/04/2016 2:07 AM End time: 01/04/2016 2:13 AM  Staffing Anesthesiologist: Margorie JohnPISCITELLO, JOSEPH K Performed: anesthesiologist   Preanesthetic Checklist Completed: patient identified, site marked, surgical consent, pre-op evaluation, timeout performed, IV checked, risks and benefits discussed and monitors and equipment checked  Epidural Patient position: sitting Prep: Betadine Patient monitoring: heart rate, continuous pulse ox and blood pressure Approach: right paramedian Location: L2-L3 Injection technique: LOR saline  Needle:  Needle type: Tuohy  Needle gauge: 17 G Needle length: 9 cm and 9 Needle insertion depth: 7 cm Catheter type: closed end flexible Catheter size: 19 Gauge Catheter at skin depth: 12 cm Test dose: negative and 1.5% lidocaine with Epi 1:200 K  Assessment Sensory level: T10 Events: blood not aspirated, injection not painful, no injection resistance, negative IV test and no paresthesia  Additional Notes Left sided paresthesia that resolved with retraction and redirection of epidural needle  Pt. Evaluated and documentation done after procedure finished. Patient identified. Risks/Benefits/Options discussed with patient including but not limited to bleeding, infection, nerve damage, paralysis, failed block, incomplete pain control, headache, blood pressure changes, nausea, vomiting, reactions to medication both or allergic, itching and postpartum back pain. Confirmed with bedside nurse the patient's most recent platelet count. Confirmed with patient that they are not currently taking any anticoagulation, have any bleeding history or any family history of bleeding disorders. Patient expressed understanding and wished to proceed. All questions were answered. Sterile technique was used throughout the entire procedure. Please see nursing notes for vital signs. Test dose was given through epidural catheter and  negative prior to continuing to dose epidural or start infusion. Warning signs of high block given to the patient including shortness of breath, tingling/numbness in hands, complete motor block, or any concerning symptoms with instructions to call for help. Patient was given instructions on fall risk and not to get out of bed. All questions and concerns addressed with instructions to call with any issues or inadequate analgesia.   Patient tolerated the insertion well without immediate complications.Reason for block:procedure for pain

## 2016-01-04 NOTE — Anesthesia Procedure Notes (Signed)
Spinal  Patient location during procedure: OR Start time: 01/04/2016 7:10 PM End time: 01/04/2016 7:14 PM Staffing Anesthesiologist: Lenard SimmerKARENZ, ANDREW Performed: anesthesiologist  Preanesthetic Checklist Completed: patient identified, site marked, surgical consent, pre-op evaluation, timeout performed, IV checked, risks and benefits discussed and monitors and equipment checked Spinal Block Patient position: sitting Prep: ChloraPrep Patient monitoring: heart rate, continuous pulse ox and blood pressure Approach: midline Location: L3-4 Injection technique: single-shot Needle Needle type: Introducer and Whitacre  Needle gauge: 24 G Needle length: 9 cm Assessment Sensory level: T4

## 2016-01-04 NOTE — Op Note (Signed)
Cesarean Section Operative Note    Haifa Hatton McKirdy   01/04/2016   Pre-operative Diagnosis:  1) intrauterine pregnancy at [redacted]w[redacted]d  2) failure to progress  Post-operative Diagnosis:  1) intrauterine pregnancy at [redacted]w[redacted]d  2) failure to progress  Procedure: Primary Low Transverse Cesarean Section via Pfannenstiel incision with double layer uterine closure  Surgeon: Surgeon(s) and Role:    * Conard Novak, MD - Primary    * Vena Austria, MD - Assisting   Anesthesia: spinal   Findings:  1) normal appearing gravid uterus, fallopian tubes, and ovaries 2) viable female infant with weight of 4,330 grams and APGARs 8 and 9   Estimated Blood Loss: 1,000 mL  Total IV Fluids: 1,800 ml   Urine Output: 300 mL clear urine at end of surgery  Specimens: None  Complications: no complications  Disposition: PACU - hemodynamically stable.   Maternal Condition: stable   Baby condition / location:  Couplet care / Skin to Skin  Procedure Details:  The patient was seen in the Holding Room. The risks, benefits, complications, treatment options, and expected outcomes were discussed with the patient. The patient concurred with the proposed plan, giving informed consent. identified as Arta E McKirdy and the procedure verified as C-Section Delivery. A Time Out was held and the above information confirmed.   After induction of anesthesia, the patient was draped and prepped in the usual sterile manner. A Pfannenstiel incision was made and carried down through the subcutaneous tissue to the fascia. Fascial incision was made and extended transversely. The fascia was separated from the underlying rectus tissue superiorly and inferiorly. The peritoneum was identified and entered. Peritoneal incision was extended longitudinally. The bladder flap was sharply freed from the lower uterine segment. A low transverse uterine incision was made and the hysterotomy was extended with cranial-caudal tension.  Delivered from cephalic presentation was a 4,330 gram Living newborn infant(s) or Female with Apgar scores of 8 at one minute and 9 at five minutes. Cord ph was not sent the umbilical cord was clamped and cut cord blood was obtained for evaluation. The placenta was removed Intact and appeared normal. The uterine outline, tubes and ovaries appeared normal. The uterine incision was closed with running locked sutures of 0 Vicryl.  A second layer of the same suture was thrown in an imbricating fashion.  Hemostasis was assured.  The uterus was returned to the abdomen and the paracolic gutters were cleared of all clots and debris.  The rectus muscles were inspected and found to be hemostatic.  The On-Q catheter pumps were inserted in accordance with the manufacturer's recommendations.  The catheters were inserted approximately 4cm cephelad to the incision line, approximately 1cm apart, straddling the midline.  They were inserted to a depth of the 4th mark. They were positioned superficial to the rectus abdominus muscles and deep to the rectus fascia.    The fascia was then reapproximated with running sutures of 1-0 PDS, looped. The subcutaneous tissue was reapproximated using 2-0 plain gut such that no greater than 2cm of dead space remained. The subcuticular closure was performed using 4-0 monocryl. The skin closure was reinforced using surgical skin glue.   The On-Q catheters were bolused with 5 mL of 0.5% marcaine plain for a total of 10 mL.  The catheters were affixed to the skin with surgical skin glue, steri-strips, and tegaderm.    Instrument, sponge, and needle counts were correct prior the abdominal closure and were correct at the conclusion of the  case.  The patient received Ancef 3 gram and azithromycin 500 mg IV prior to skin incision (within 30 minutes). For VTE prophylaxis she was wearing SCDs throughout the case.   Signed: Conard NovakStephen D. Tyniah Kastens, MD 01/04/2016 8:48 PM

## 2016-01-04 NOTE — Progress Notes (Signed)
Labor & Delivery Progress Note  30 y.o. G1P0 @ 1079w3d, admitted for pregnancy, labor management, IOL for obesity/postdates, SROM 9/24 @ 1447, particulate mec.  Epidural placed x3 overnight.  Pitocin started early this morning.  Subjective:  Intermittent nausea, right side more numb than left, nasal stuffiness, requesting to use own afrin  Objective: BP 129/63   Pulse (!) 108   Temp 97.9 F (36.6 C) (Oral)   Resp 17   Ht 5\' 4"  (1.626 m)   Wt 130.2 kg (287 lb)   LMP 03/20/2015   SpO2 96%   BMI 49.26 kg/m   General: Appears comfortable, nasal congestion noted EFM: Baseline 135-140, accels noted to 160-170s, moderate to marked variability, occ variable deceleration Toco: q3-4, lasting 60-70, pitocin at 7 milliunit/minute Cervix: 4/75/-2, forebag felt  Assessment: Some effacement since pitocin started, now induction day 3.   Comfortable with epidural.   FWB: Cat 1 tracing  Plan: AROM forebag, IUPC placed.  Titrate pitocin to 200 MVU.  Monitor progress and FWB.    Tonye PearsonLauren Friedman SNM/ Farrel ConnersGUTIERREZ, Solana Coggin, CNM

## 2016-01-04 NOTE — Transfer of Care (Signed)
Immediate Anesthesia Transfer of Care Note  Patient: Ashlee Wilson  Procedure(s) Performed: Procedure(s): CESAREAN SECTION (N/A)  Patient Location: PACU  Anesthesia Type:Spinal  Level of Consciousness: awake, alert , oriented and patient cooperative  Airway & Oxygen Therapy: Patient Spontanous Breathing  Post-op Assessment: Report given to RN and Post -op Vital signs reviewed and stable  Post vital signs: Reviewed and stable  Last Vitals:  Vitals:   01/04/16 1724 01/04/16 1819  BP: (!) 147/97 137/72  Pulse: (!) 101 (!) 111  Resp:  18  Temp:  37.8 C    Last Pain:  Vitals:   01/04/16 1819  TempSrc: Axillary  PainSc:          Complications: No apparent anesthesia complications

## 2016-01-04 NOTE — Progress Notes (Signed)
Patient did not progress on pitocin with adequate contractions as measured by IUPC.  Discussed recommendation to proceed with c-section. All questions asked.  Personally reviewed consent form.   Patient voiced understanding and agreement with plan.   Thomasene MohairStephen Ikey Omary, MD 01/04/2016 5:55 PM

## 2016-01-04 NOTE — Progress Notes (Signed)
L&D Note  S; IN a lot of pain, on left hip and in lower abdomen. "I don't think my epidural is working any more!"  O: BP (!) 147/97   Pulse (!) 101   Temp 99.5 F (37.5 C) (Axillary)   Resp 20   Ht 5\' 4"  (1.626 m)   Wt 130.2 kg (287 lb)   LMP 03/20/2015   SpO2 96%   BMI 49.26 kg/m   General: crying out with pain FHR: 150s with accelerations to 170s, moderate variability, repetitive variable decelerations with contractions IUPC: has been adequate since 1000 today mvus 200-300+ Cervix: 6/70%/-2 (no change x 4-5 hours  A: FTP P: Consulted Dr Jean RosenthalJackson. Pitocin discontinued. Plan Cesarean section Azithromycin and Kefzol on call to OR Repeat CBC  Ashlee Wilson, CNM

## 2016-01-04 NOTE — Discharge Summary (Signed)
OB Discharge Summary     Patient Name: Ashlee Wilson DOB: 08-02-1985 MRN: [id number]  Date of admission: 01/02/2016 Delivering MD: Conard Novak, MD  Date of Delivery: 01/04/2016  Date of discharge: 01/07/2016  Admitting diagnosis: Induction C-section Intrauterine pregnancy: [redacted]w[redacted]d     Secondary diagnosis: None     Discharge diagnosis: Term Pregnancy Delivered                                                                                                Post partum procedures:none  Augmentation: Pitocin, Cytotec, Foley Balloon and Cervidil  Complications: None  Hospital course:  Onset of Labor With Unplanned C/S  30 y.o. yo G1P1000 at [redacted]w[redacted]d was admitted for induction of labor on 01/02/2016. Patient had a labor course significant for cervidil, cytotec, foley balloon for cervical ripening.  She had SROM on 9/23. Pitocin was also utilized.  She had and IUPC and an FSE placed and was achieving adequate contractions without cervical change at 6cm.  Membrane Rupture Time/Date: 2:47 PM ,01/03/2016   The patient went for cesarean section due to Arrest of Dilation, and delivered a Viable infant,01/04/2016  Details of operation can be found in separate operative note. Patient had an uncomplicated postpartum course.  She is ambulating,tolerating a regular diet, passing flatus, and urinating well.  Patient is discharged home in stable condition 01/07/16.   Physical exam  Vitals:   01/06/16 2007 01/06/16 2355 01/07/16 0300 01/07/16 0737  BP: 124/65   113/71  Pulse: (!) 105   (!) 105  Resp: 20   18  Temp: 97.9 F (36.6 C) 98 F (36.7 C) 98.1 F (36.7 C) 98.1 F (36.7 C)  TempSrc: Oral Oral Oral Oral  SpO2:      Weight:      Height:       General: alert, cooperative and no distress Lochia: appropriate Uterine Fundus: firm Incision: Healing well with no significant drainage, Dressing is clean, dry, and intact DVT Evaluation: No evidence of DVT seen on physical exam.  Labs: Lab  Results  Component Value Date   WBC 16.5 (H) 01/05/2016   HGB 9.2 (L) 01/05/2016   HCT 27.9 (L) 01/05/2016   MCV 78.9 (L) 01/05/2016   PLT 177 01/05/2016   No flowsheet data found.  Discharge instruction: per After Visit Summary.  Medications:    Medication List    TAKE these medications   calcium carbonate 500 MG chewable tablet Commonly known as:  TUMS - dosed in mg elemental calcium Chew 2 tablets by mouth at bedtime as needed for indigestion or heartburn.   HYDROcodone-acetaminophen 5-325 MG tablet Commonly known as:  NORCO/VICODIN Take 1-2 tablets by mouth every 4 (four) hours as needed for severe pain.   oxymetazoline 0.05 % nasal spray Commonly known as:  AFRIN Place 1 spray into both nostrils 2 (two) times daily.     Prenatal Vitamin with Iron  Diet: routine diet  Activity: Advance as tolerated. Pelvic rest for 6 weeks.   Outpatient follow up: Follow-up Information    Conard Novak, MD. Go on 01/12/2016.   Specialty:  Obstetrics and Gynecology Why:  post-op incision check at 9:40am  Contact information: [address] [place] [address] [phone]             Postpartum contraception: considering nexplanon vs mirena Rhogam Given postpartum: no Rubella vaccine given postpartum: no Varicella vaccine given postpartum: no TDaP given antepartum or postpartum: given antepartum Flu vaccine given postpartum prior to discharge  Newborn Data: Live born female  Birth Weight: 9 lb 8.7 oz (4330 g) APGAR: 8, 9   Baby Feeding: Breast  Disposition:home with mother  SIGNED: Tresea MallGLEDHILL,Rosmarie Esquibel, CNM

## 2016-01-05 ENCOUNTER — Encounter: Payer: Self-pay | Admitting: Obstetrics and Gynecology

## 2016-01-05 LAB — CBC
HCT: 27.9 % — ABNORMAL LOW (ref 35.0–47.0)
Hemoglobin: 9.2 g/dL — ABNORMAL LOW (ref 12.0–16.0)
MCH: 26.1 pg (ref 26.0–34.0)
MCHC: 33.1 g/dL (ref 32.0–36.0)
MCV: 78.9 fL — AB (ref 80.0–100.0)
PLATELETS: 177 10*3/uL (ref 150–440)
RBC: 3.54 MIL/uL — ABNORMAL LOW (ref 3.80–5.20)
RDW: 17.1 % — AB (ref 11.5–14.5)
WBC: 16.5 10*3/uL — ABNORMAL HIGH (ref 3.6–11.0)

## 2016-01-05 MED ORDER — LORATADINE 10 MG PO TABS
10.0000 mg | ORAL_TABLET | Freq: Every day | ORAL | Status: DC
  Administered 2016-01-06 – 2016-01-07 (×2): 10 mg via ORAL
  Filled 2016-01-05 (×2): qty 1

## 2016-01-05 MED ORDER — HYDROCODONE-ACETAMINOPHEN 5-325 MG PO TABS
1.0000 | ORAL_TABLET | ORAL | Status: DC | PRN
  Administered 2016-01-05 – 2016-01-07 (×10): 2 via ORAL
  Filled 2016-01-05 (×10): qty 2

## 2016-01-05 MED ORDER — IBUPROFEN 600 MG PO TABS
600.0000 mg | ORAL_TABLET | Freq: Four times a day (QID) | ORAL | Status: DC | PRN
  Administered 2016-01-05 – 2016-01-07 (×7): 600 mg via ORAL
  Filled 2016-01-05 (×8): qty 1

## 2016-01-05 MED ORDER — KETOROLAC TROMETHAMINE 30 MG/ML IJ SOLN
30.0000 mg | Freq: Four times a day (QID) | INTRAMUSCULAR | Status: DC | PRN
  Administered 2016-01-05: 30 mg via INTRAVENOUS
  Filled 2016-01-05: qty 1

## 2016-01-05 MED ORDER — SALINE SPRAY 0.65 % NA SOLN
1.0000 | NASAL | Status: DC | PRN
  Filled 2016-01-05: qty 44

## 2016-01-05 NOTE — Progress Notes (Signed)
  Postpartum Day 1  Subjective: Resting in bed, c/o severe pain with any movement  Objective: Blood pressure 119/70, pulse (!) 112, temperature 98.4 F (36.9 C), temperature source Oral, resp. rate 18, height 5\' 4"  (1.626 m), weight 287 lb (130.2 kg), last menstrual period 03/20/2015, SpO2 97 %  Physical Exam:  General: cooperative Lochia: appropriate Uterine Fundus: firm Incision: healing well DVT Evaluation: No evidence of DVT seen on physical exam. SCDs in place Abdomen: soft, NT   Recent Labs  01/04/16 1744 01/05/16 0453  HGB 11.7* 9.2*  HCT 33.7* 27.9*    Assessment POD #1, acute blood loss anemia  Plan: Continue PO care, Advance activity as tolerated and Fe replacement, anemia precautions  Medication orders updated to advance to po now  Feeding: breast Blood Type: O+ RI/VI TDAP:     Marta AntuBrothers, Wyett Narine, CNM 01/05/2016, 10:31 AM

## 2016-01-05 NOTE — Anesthesia Postprocedure Evaluation (Signed)
Anesthesia Post Note  Patient: Ashlee Wilson  Procedure(s) Performed: Procedure(s) (LRB): CESAREAN SECTION (N/A)  Patient location during evaluation: Mother Baby Anesthesia Type: Spinal Level of consciousness: awake, awake and alert, oriented and patient cooperative Pain management: pain level controlled (pain level 5/10 and requesting meds, nurse notified, meds due at 0738.) Vital Signs Assessment: post-procedure vital signs reviewed and stable Respiratory status: spontaneous breathing, nonlabored ventilation and respiratory function stable Cardiovascular status: stable Postop Assessment: no signs of nausea or vomiting, adequate PO intake and patient able to bend at knees Anesthetic complications: no    Last Vitals:  Vitals:   01/05/16 0115 01/05/16 0315  BP: (!) 107/58 117/70  Pulse: (!) 110 (!) 116  Resp: 20 20  Temp: 37.2 C 36.9 C    Last Pain:  Vitals:   01/05/16 0345  TempSrc:   PainSc: 0-No pain                 [name]

## 2016-01-05 NOTE — Anesthesia Post-op Follow-up Note (Signed)
  Anesthesia Pain Follow-up Note  Patient: Ashlee Wilson  Day #: 1  Date of Follow-up: 01/05/2016 Time: 7:37 AM  Last Vitals:  Vitals:   01/05/16 0115 01/05/16 0315  BP: (!) 107/58 117/70  Pulse: (!) 110 (!) 116  Resp: 20 20  Temp: 37.2 C 36.9 C    Level of Consciousness: alert  Pain: 5 /10   Side Effects:None  Catheter Site Exam:clean, dry     Plan: D/C from anesthesia care  Marlana SalvageSandra Henrik Orihuela

## 2016-01-06 NOTE — Progress Notes (Signed)
  Subjective:  Doing well no concerns. Still feel sore.  Minimal lochia  Objective:  Blood pressure 131/78, pulse 99, temperature 97.9 F (36.6 C), temperature source Oral, resp. rate 20, height 5\' 4"  (1.626 m), weight 287 lb (130.2 kg), last menstrual period 03/20/2015, SpO2 98 %, unknown if currently breastfeeding.  General: NAD Pulmonary: no increased work of breathing Abdomen: non-distended, non-tender, fundus firm at level of umbilicus Incision: D/C/I Extremities: no edema, no erythema, no tenderness  Results for orders placed or performed during the hospital encounter of 01/02/16 (from the past 72 hour(s))  Platelet count     Status: None   Collection Time: 01/03/16  6:22 PM  Result Value Ref Range   Platelets 187 150 - 440 K/uL  CBC     Status: Abnormal   Collection Time: 01/04/16  5:44 PM  Result Value Ref Range   WBC 16.6 (H) 3.6 - 11.0 K/uL   RBC 4.33 3.80 - 5.20 MIL/uL   Hemoglobin 11.7 (L) 12.0 - 16.0 g/dL   HCT 09.833.7 (L) 11.935.0 - 14.747.0 %   MCV 77.8 (L) 80.0 - 100.0 fL   MCH 27.0 26.0 - 34.0 pg   MCHC 34.7 32.0 - 36.0 g/dL   RDW 82.917.0 (H) 56.211.5 - 13.014.5 %   Platelets 192 150 - 440 K/uL  CBC     Status: Abnormal   Collection Time: 01/05/16  4:53 AM  Result Value Ref Range   WBC 16.5 (H) 3.6 - 11.0 K/uL   RBC 3.54 (L) 3.80 - 5.20 MIL/uL   Hemoglobin 9.2 (L) 12.0 - 16.0 g/dL   HCT 86.527.9 (L) 78.435.0 - 69.647.0 %   MCV 78.9 (L) 80.0 - 100.0 fL   MCH 26.1 26.0 - 34.0 pg   MCHC 33.1 32.0 - 36.0 g/dL   RDW 29.517.1 (H) 28.411.5 - 13.214.5 %   Platelets 177 150 - 440 K/uL     Assessment:   29 y.o. G1P1000 postoperativeday # 2 1LTCS for APA,   Plan:  1) Acute blood loss anemia - hemodynamically stable and asymptomatic - po ferrous sulfate  2) --/--/O POS (09/23 [id number]) / Rubella Immune (03/24 0000) / Varicella Immune  3) Breast/Nexplanon  4) Disposition - anticipate discharge POD3-4

## 2016-01-07 MED ORDER — HYDROCODONE-ACETAMINOPHEN 5-325 MG PO TABS: 1.0000 | ORAL_TABLET | ORAL | 0 refills | Status: DC | PRN

## 2016-01-07 MED ORDER — INFLUENZA VAC SPLIT QUAD 0.5 ML IM SUSY
0.5000 mL | PREFILLED_SYRINGE | Freq: Once | INTRAMUSCULAR | Status: AC
Start: 2016-01-07 — End: 2016-01-07
  Administered 2016-01-07: 0.5 mL via INTRAMUSCULAR
  Filled 2016-01-07: qty 0.5

## 2016-01-07 NOTE — Progress Notes (Signed)
Patient discharged home with infant. Vital signs stable, bleeding within normal limits, uterus firm. Discharge instructions, prescriptions, and follow up appointment given to and reviewed with patient. Patient verbalized understanding, all questions answered. Escorted in wheelchair by auxiliary.    Prudy Candy, RN  

## 2016-01-07 NOTE — Discharge Instructions (Signed)
Cesarean Delivery, Care After °Refer to this sheet in the next few weeks. These instructions provide you with information on caring for yourself after your procedure. Your health care provider may also give you specific instructions. Your treatment has been planned according to current medical practices, but problems sometimes occur. Call your health care provider if you have any problems or questions after you go home. °HOME CARE INSTRUCTIONS  °· Only take over-the-counter or prescription medications as directed by your health care provider. °· Do not drink alcohol, especially if you are breastfeeding or taking medication to relieve pain. °· Do not chew or smoke tobacco. °· Continue to use good perineal care. Good perineal care includes: °¨ Wiping your perineum from front to back. °¨ Keeping your perineum clean. °· Check your surgical cut (incision) daily for increased redness, drainage, swelling, or separation of skin. °· Clean your incision gently with soap and water every day, and then pat it dry. If your health care provider says it is okay, leave the incision uncovered. Use a bandage (dressing) if the incision is draining fluid or appears irritated. If the adhesive strips across the incision do not fall off within 7 days, carefully peel them off. °· Hug a pillow when coughing or sneezing until your incision is healed. This helps to relieve pain. °· Do not use tampons or douche for the next 6 weeks.  °· Showers only, no tub baths for the next 6 weeks.  °· Wear a well-fitting bra that provides breast support. °· Limit wearing support panties or control-top hose. °· Drink enough fluids to keep your urine clear or pale yellow. °· Eat high-fiber foods such as whole grain cereals and breads, brown rice, beans, and fresh fruits and vegetables every day. These foods may help prevent or relieve constipation. °· Resume activities such as climbing stairs, driving, lifting, exercising, or traveling as directed by your  health care provider. °· No heavy lifting or strenuous activity for the next 6 weeks.  °· No sexual intercourse for at least the next 6 weeks, and then not until you feel ready.  °· Try to have someone help you with your household activities and your newborn for at least a few days after you leave the hospital. °· Rest as much as possible. Try to rest or take a nap when your newborn is sleeping. °· Increase your activities gradually. °· Keep all of your scheduled postpartum appointments. It is very important to keep your scheduled follow-up appointments. At these appointments, your health care provider will be checking to make sure that you are healing physically and emotionally. °SEEK MEDICAL CARE IF:  °· You are passing large clots from your vagina. Save any clots to show your health care provider. °· You have a foul smelling discharge from your vagina. °· You have trouble urinating. °· You are urinating frequently. °· You have pain when you urinate. °· You have a change in your bowel movements. °· You have increasing redness, pain, or swelling near your incision. °· You have pus draining from your incision. °· Your incision is separating. °· You have painful, hard, or reddened breasts. °· You have a severe headache. °· You have blurred vision or see spots. °· You feel sad or depressed. °· You have thoughts of hurting yourself or your newborn. °· You have questions about your care, the care of your newborn, or medications. °· You are dizzy or light-headed. °· You have a rash. °· You have pain, redness, or swelling at the   site of the removed intravenous access (IV) tube. °· You have nausea or vomiting. °· You stopped breastfeeding and have not had a menstrual period within 12 weeks of stopping. °· You are not breastfeeding and have not had a menstrual period within 12 weeks of delivery. °· You have a fever. °SEEK IMMEDIATE MEDICAL CARE IF: °· You have persistent pain. °· You have chest pain. °· You have shortness  of breath. °· You faint. °· You have leg pain. °· You have stomach pain. °· Your vaginal bleeding saturates 2 or more sanitary pads in 1 hour. °MAKE SURE YOU:  °· Understand these instructions. °· Will watch your condition. °· Will get help right away if you are not doing well or get worse. °  °This information is not intended to replace advice given to you by your health care provider. Make sure you discuss any questions you have with your health care provider. °  °Document Released: 12/18/2001 Document Revised: 04/18/2014 Document Reviewed: 11/23/2011 °Elsevier Interactive Patient Education ©2016 [employer] ° °

## 2016-03-10 ENCOUNTER — Encounter: Payer: Self-pay | Admitting: Physician Assistant

## 2016-03-10 ENCOUNTER — Ambulatory Visit (INDEPENDENT_AMBULATORY_CARE_PROVIDER_SITE_OTHER): Payer: Self-pay | Admitting: Physician Assistant

## 2016-03-10 ENCOUNTER — Encounter: Payer: Self-pay | Admitting: *Deleted

## 2016-03-10 VITALS — BP 112/68 | HR 88 | Temp 98.0°F | Resp 16 | Wt 263.0 lb

## 2016-03-10 DIAGNOSIS — L0501 Pilonidal cyst with abscess: Secondary | ICD-10-CM

## 2016-03-10 MED ORDER — CEPHALEXIN 500 MG PO CAPS: 500.0000 mg | ORAL_CAPSULE | Freq: Four times a day (QID) | ORAL | 0 refills | Status: AC

## 2016-03-10 NOTE — Patient Instructions (Addendum)
Pilonidal Cyst Introduction A pilonidal cyst is a fluid-filled sac. It forms beneath the skin near your tailbone, at the top of the crease of your buttocks. A pilonidal cyst that is not large or infected may not cause symptoms or problems. If the cyst becomes irritated or infected, it may fill with pus. This causes pain and swelling (pilonidal abscess). An infected cyst may need to be treated with medicine, drained, or removed. What are the causes? The cause of a pilonidal cyst is not known. One cause may be a hair that grows into your skin (ingrown hair). What increases the risk? Pilonidal cysts are more common in boys and men. Risk factors include:  Having lots of hair near the crease of the buttocks.  Being overweight.  Having a pilonidal dimple.  Wearing tight clothing.  Not bathing or showering frequently.  Sitting for long periods of time. What are the signs or symptoms? Signs and symptoms of a pilonidal cyst may include:  Redness.  Pain and tenderness.  Warmth.  Swelling.  Pus.  Fever. How is this diagnosed? Your health care provider may diagnose a pilonidal cyst based on your symptoms and a physical exam. The health care provider may do a blood test to check for infection. If your cyst is draining pus, your health care provider may take a sample of the drainage to be tested at a laboratory. How is this treated? Surgery is the usual treatment for an infected pilonidal cyst. You may also have to take medicines before surgery. The type of surgery you have depends on the size and severity of the infected cyst. The different kinds of surgery include:  Incision and drainage. This is a procedure to open and drain the cyst.  Marsupialization. In this procedure, a large cyst or abscess may be opened and kept open by stitching the edges of the skin to the cyst walls.  Cyst removal. This procedure involves opening the skin and removing all or part of the cyst. Follow these  instructions at home:  Follow all of your surgeon's instructions carefully if you had surgery.  Take medicines only as directed by your health care provider.  If you were prescribed an antibiotic medicine, finish it all even if you start to feel better.  Keep the area around your pilonidal cyst clean and dry.  Clean the area as directed by your health care provider. Pat the area dry with a clean towel. Do not rub it as this may cause bleeding.  Remove hair from the area around the cyst as directed by your health care provider.  Do not wear tight clothing or sit in one place for long periods of time.  There are many different ways to close and cover an incision, including stitches, skin glue, and adhesive strips. Follow your health care provider's instructions on:  Incision care.  Bandage (dressing) changes and removal.  Incision closure removal. Contact a health care provider if:  You have drainage, redness, swelling, or pain at the site of the cyst.  You have a fever. This information is not intended to replace advice given to you by your health care provider. Make sure you discuss any questions you have with your health care provider. Document Released: 03/25/2000 Document Revised: 09/03/2015 Document Reviewed: 08/15/2013  2017 Elsevier  

## 2016-03-10 NOTE — Progress Notes (Addendum)
Patient: Ashlee Wilson Female    DOB: 1985/10/21   30 y.o.   MRN: [id number] Visit Date: 03/10/2016  Today's Provider: Trey SailorsAdriana M Pollak, PA-C   Chief Complaint  Patient presents with  . Cyst   Subjective:    HPI  Pt comes into to the office complaining pilonidal cyst.  She has had this twice before while pregnant for her daughter.  This is her third flare within a year.  Pt report pain and tenderness at the site.  She is unable to sit comfortably. She is currently breast feeding at this time.  Patient reports she called her OBGYN who told her to have appointment here for I&D and then schedule surgery.   No fever, chills, nausea, vomiting, diarrhea.  Patient has PCN listed in her allergies on EPIC, but reports that she has had Keflex for this problem before prescribed by ger OBGYN and tolerated it without side effects.   Allergies  Allergen Reactions  . Benadryl [Diphenhydramine] Other (See Comments)    HR slowed, and BP dropped  . Oxycodone-Acetaminophen Other (See Comments)    Pt expressed that she received for mouth sx years ago- pain med was such- and pt became incapcitated  . Paroxetine   . Penicillins Other (See Comments)    Pt states occurred as a baby, does not know reaction, but avoids taking such     Current Outpatient Prescriptions:  .  cephALEXin (KEFLEX) 500 MG capsule, Take 1 capsule (500 mg total) by mouth 4 (four) times daily., Disp: 40 capsule, Rfl: 0  Review of Systems  Constitutional: Negative.   Musculoskeletal: Positive for back pain. Negative for arthralgias, gait problem, joint swelling, myalgias, neck pain and neck stiffness.  Skin: Positive for wound. Negative for color change, pallor and rash.    Social History  Substance Use Topics  . Smoking status: Former Smoker    Quit date: 04/16/2007  . Smokeless tobacco: Never Used  . Alcohol use No   Objective:   BP 112/68 (BP Location: Left Arm, Patient Position: Sitting, Cuff Size:  Large)   Pulse 88   Temp 98 F (36.7 C) (Oral)   Resp 16   Wt 263 lb (119.3 kg)   BMI 45.14 kg/m   Physical Exam  Constitutional: She appears well-developed and well-nourished. She does not have a sickly appearance.  Skin:           Assessment & Plan:      Problem List Items Addressed This Visit    None    Visit Diagnoses    Pilonidal abscess    -  Primary   Relevant Medications   cephALEXin (KEFLEX) 500 MG capsule   Other Relevant Orders   Ambulatory referral to General Surgery     Patient is 30 y/o female presenting with recurrent pilonidal cyst. Do not want to I&D in clinic today 2/2 concern for communicating sinus tracts that need surgical intervention. Have referred urgently to general surgery as patient having difficulty sitting and caring for her newborn. Have given Keflex as patient has tolerated this before. Patient may continue warm soaks and Tylenol for pain relief.   Return if symptoms worsen or fail to improve.   Patient Instructions  Pilonidal Cyst Introduction A pilonidal cyst is a fluid-filled sac. It forms beneath the skin near your tailbone, at the top of the crease of your buttocks. A pilonidal cyst that is not large or infected may not cause symptoms or  problems. If the cyst becomes irritated or infected, it may fill with pus. This causes pain and swelling (pilonidal abscess). An infected cyst may need to be treated with medicine, drained, or removed. What are the causes? The cause of a pilonidal cyst is not known. One cause may be a hair that grows into your skin (ingrown hair). What increases the risk? Pilonidal cysts are more common in boys and men. Risk factors include:  Having lots of hair near the crease of the buttocks.  Being overweight.  Having a pilonidal dimple.  Wearing tight clothing.  Not bathing or showering frequently.  Sitting for long periods of time. What are the signs or symptoms? Signs and symptoms of a pilonidal cyst  may include:  Redness.  Pain and tenderness.  Warmth.  Swelling.  Pus.  Fever. How is this diagnosed? Your health care provider may diagnose a pilonidal cyst based on your symptoms and a physical exam. The health care provider may do a blood test to check for infection. If your cyst is draining pus, your health care provider may take a sample of the drainage to be tested at a laboratory. How is this treated? Surgery is the usual treatment for an infected pilonidal cyst. You may also have to take medicines before surgery. The type of surgery you have depends on the size and severity of the infected cyst. The different kinds of surgery include:  Incision and drainage. This is a procedure to open and drain the cyst.  Marsupialization. In this procedure, a large cyst or abscess may be opened and kept open by stitching the edges of the skin to the cyst walls.  Cyst removal. This procedure involves opening the skin and removing all or part of the cyst. Follow these instructions at home:  Follow all of your surgeon's instructions carefully if you had surgery.  Take medicines only as directed by your health care provider.  If you were prescribed an antibiotic medicine, finish it all even if you start to feel better.  Keep the area around your pilonidal cyst clean and dry.  Clean the area as directed by your health care provider. Pat the area dry with a clean towel. Do not rub it as this may cause bleeding.  Remove hair from the area around the cyst as directed by your health care provider.  Do not wear tight clothing or sit in one place for long periods of time.  There are many different ways to close and cover an incision, including stitches, skin glue, and adhesive strips. Follow your health care provider's instructions on:  Incision care.  Bandage (dressing) changes and removal.  Incision closure removal. Contact a health care provider if:  You have drainage, redness,  swelling, or pain at the site of the cyst.  You have a fever. This information is not intended to replace advice given to you by your health care provider. Make sure you discuss any questions you have with your health care provider. Document Released: 03/25/2000 Document Revised: 09/03/2015 Document Reviewed: 08/15/2013  2017 Elsevier    The entirety of the information documented in the History of Present Illness, Review of Systems and Physical Exam were personally obtained by me. Portions of this information were initially documented by Kavin LeechLaura Nixie Laube, CMA and reviewed by me for thoroughness and accuracy.   Trey Sailors.        Adriana M Pollak, PA-C  [facility]

## 2016-03-16 ENCOUNTER — Ambulatory Visit: Payer: Self-pay | Admitting: General Surgery

## 2016-04-06 ENCOUNTER — Ambulatory Visit: Payer: Self-pay | Admitting: General Surgery

## 2016-04-12 ENCOUNTER — Ambulatory Visit: Payer: Self-pay | Admitting: General Surgery

## 2016-04-21 ENCOUNTER — Ambulatory Visit: Payer: Self-pay | Admitting: General Surgery

## 2016-05-12 ENCOUNTER — Encounter: Payer: Self-pay | Admitting: Physician Assistant

## 2016-05-12 ENCOUNTER — Ambulatory Visit (INDEPENDENT_AMBULATORY_CARE_PROVIDER_SITE_OTHER): Payer: BLUE CROSS/BLUE SHIELD | Admitting: Physician Assistant

## 2016-05-12 VITALS — BP 124/82 | HR 80 | Temp 98.5°F | Resp 16 | Wt 262.0 lb

## 2016-05-12 DIAGNOSIS — R519 Headache, unspecified: Secondary | ICD-10-CM

## 2016-05-12 DIAGNOSIS — R51 Headache: Secondary | ICD-10-CM

## 2016-05-12 DIAGNOSIS — R5381 Other malaise: Secondary | ICD-10-CM | POA: Diagnosis not present

## 2016-05-12 NOTE — Progress Notes (Signed)
Patient: Ashlee Wilson Female    DOB: 1985/07/24   31 y.o.   MRN: [id number] Visit Date: 05/13/2016  Today's Provider: Trey SailorsAdriana M Pollak, PA-C   Chief Complaint  Patient presents with  . Headache    Started Saturday   Subjective:    Headache   This is a new problem. The current episode started in the past 7 days. The problem occurs constantly. The pain is located in the bilateral region. The pain does not radiate. The pain quality is similar to prior headaches (Towards the end of her pregnancy). The quality of the pain is described as aching. The pain is at a severity of 4/10. Associated symptoms include dizziness and nausea. Pertinent negatives include no abdominal pain, blurred vision, ear pain, eye pain, eye redness, eye watering, fever, insomnia, loss of balance, phonophobia, photophobia, visual change or vomiting.   Patient is a 31 y/o woman four months postpartum currently breastfeeding who is presenting today with a headache ongoing for the past three days. It starts in the back of her necks and is in her bilateral temples. Not throbbing currently, and is getting better in the past few days. Minimal response to 400 mg Ibuprofen BID and 1000mg  Tylenol. Feels slightly dizzy and nauseas, which she thinks is due to her headache. She has been feeling "not well" for the past few days. Denies fevers, coughs, changes in bowel movements, runny nose, problems eating, urinary symptoms. This is concerning to her because she is having a hard time having energy to take care of her four month old. Denies feelings of depression, SI/HI, wanting to hurt her child. Says her daughter is sleeping through the night. Only difficult thing she must breastfeed her daughter 2/2 allergies and this precludes many medications for patient. This is also the reason she had to postpone her pilonidal cyst surgery.     Allergies  Allergen Reactions  . Benadryl [Diphenhydramine] Other (See Comments)    HR slowed,  and BP dropped  . Oxycodone-Acetaminophen Other (See Comments)    Pt expressed that she received for mouth sx years ago- pain med was such- and pt became incapcitated  . Paroxetine   . Penicillins Other (See Comments)    Pt states occurred as a baby, does not know reaction, but avoids taking such    No current outpatient prescriptions on file.  Review of Systems  Constitutional: Positive for fatigue. Negative for activity change, appetite change, chills, diaphoresis, fever and unexpected weight change.  HENT: Negative for ear pain.   Eyes: Negative for blurred vision, photophobia, pain and redness.  Respiratory: Negative.   Gastrointestinal: Positive for nausea. Negative for abdominal distention, abdominal pain, anal bleeding, blood in stool, constipation, diarrhea, rectal pain and vomiting.  Endocrine: Negative for cold intolerance, heat intolerance, polydipsia, polyphagia and polyuria.  Musculoskeletal: Positive for myalgias.  Neurological: Positive for dizziness, light-headedness and headaches. Negative for loss of balance.  Psychiatric/Behavioral: The patient does not have insomnia.     Social History  Substance Use Topics  . Smoking status: Former Smoker    Quit date: 04/16/2007  . Smokeless tobacco: Never Used  . Alcohol use No   Objective:   BP 124/82 (BP Location: Right Arm, Patient Position: Sitting, Cuff Size: Large)   Pulse 80   Temp 98.5 F (36.9 C) (Oral)   Resp 16   Wt 262 lb (118.8 kg)   LMP 05/09/2016   BMI 44.97 kg/m   Physical Exam  Constitutional:  She is oriented to person, place, and time. She appears well-developed and well-nourished.  Teary in office  HENT:  Right Ear: Tympanic membrane and external ear normal.  Left Ear: Tympanic membrane and external ear normal.  Mouth/Throat: Oropharynx is clear and moist. No oropharyngeal exudate.  Eyes: Conjunctivae and EOM are normal. Pupils are equal, round, and reactive to light. Right eye exhibits no  discharge. Left eye exhibits no discharge.  Neck: Neck supple.  Cardiovascular: Normal rate, regular rhythm and normal heart sounds.  Exam reveals no gallop and no friction rub.   No murmur heard. Pulmonary/Chest: Effort normal and breath sounds normal.  Abdominal: Soft. Bowel sounds are normal.  Lymphadenopathy:    She has no cervical adenopathy.  Neurological: She is alert and oriented to person, place, and time. She has normal reflexes. No cranial nerve deficit. Coordination and gait normal. GCS eye subscore is 4. GCS verbal subscore is 5. GCS motor subscore is 6.  5/5 strength in all extremities.  Skin: Skin is warm and dry.  Psychiatric: She has a normal mood and affect. Her behavior is normal.        Assessment & Plan:     1. Nonintractable headache, unspecified chronicity pattern, unspecified headache type  Sounds like tension type headache. Headache, per pt, appears to be getting better but not gone completely. Low suspicion for embolic event. No neruodeficits today on exam, blood pressure normotensive. Patient is concerned because this is interfering with how she is able to take care of her daughter. Difficult to treat OTC since patient is breastfeeding. Safest option would be Tylenol.  2. Malaise  Patient has general malaise, sense of not feeling well, but difficult to pinpoint any symptoms that might require specific treatment. Likely combination of viral syndrome and exertion from caring for new child. Have instructed patient she can call back if she gets any new symptoms or otherwise she may come to our Saturday clinic.  Return if symptoms worsen or fail to improve.  Patient Instructions  Fatigue Introduction Fatigue is feeling tired all of the time, a lack of energy, or a lack of motivation. Occasional or mild fatigue is often a normal response to activity or life in general. However, long-lasting (chronic) or extreme fatigue may indicate an underlying medical  condition. Follow these instructions at home: Watch your fatigue for any changes. The following actions may help to lessen any discomfort you are feeling:  Talk to your health care provider about how much sleep you need each night. Try to get the required amount every night.  Take medicines only as directed by your health care provider.  Eat a healthy and nutritious diet. Ask your health care provider if you need help changing your diet.  Drink enough fluid to keep your urine clear or pale yellow.  Practice ways of relaxing, such as yoga, meditation, massage therapy, or acupuncture.  Exercise regularly.  Change situations that cause you stress. Try to keep your work and personal routine reasonable.  Do not abuse illegal drugs.  Limit alcohol intake to no more than 1 drink per day for nonpregnant women and 2 drinks per day for men. One drink equals 12 ounces of beer, 5 ounces of wine, or 1 ounces of hard liquor.  Take a multivitamin, if directed by your health care provider. Contact a health care provider if:  Your fatigue does not get better.  You have a fever.  You have unintentional weight loss or gain.  You have headaches.  You have difficulty:  Falling asleep.  Sleeping throughout the night.  You feel angry, guilty, anxious, or sad.  You are unable to have a bowel movement (constipation).  You skin is dry.  Your legs or another part of your body is swollen. Get help right away if:  You feel confused.  Your vision is blurry.  You feel faint or pass out.  You have a severe headache.  You have severe abdominal, pelvic, or back pain.  You have chest pain, shortness of breath, or an irregular or fast heartbeat.  You are unable to urinate or you urinate less than normal.  You develop abnormal bleeding, such as bleeding from the rectum, vagina, nose, lungs, or nipples.  You vomit blood.  You have thoughts about harming yourself or committing  suicide.  You are worried that you might harm someone else. This information is not intended to replace advice given to you by your health care provider. Make sure you discuss any questions you have with your health care provider. Document Released: 01/23/2007 Document Revised: 09/03/2015 Document Reviewed: 07/30/2013  2017 Elsevier    The entirety of the information documented in the History of Present Illness, Review of Systems and Physical Exam were personally obtained by me. Portions of this information were initially documented by Kavin Leech, CMA and reviewed by me for thoroughness and accuracy.           Trey Sailors, PA-C  [facility]

## 2016-05-13 NOTE — Patient Instructions (Signed)
Fatigue Introduction Fatigue is feeling tired all of the time, a lack of energy, or a lack of motivation. Occasional or mild fatigue is often a normal response to activity or life in general. However, long-lasting (chronic) or extreme fatigue may indicate an underlying medical condition. Follow these instructions at home: Watch your fatigue for any changes. The following actions may help to lessen any discomfort you are feeling:  Talk to your health care provider about how much sleep you need each night. Try to get the required amount every night.  Take medicines only as directed by your health care provider.  Eat a healthy and nutritious diet. Ask your health care provider if you need help changing your diet.  Drink enough fluid to keep your urine clear or pale yellow.  Practice ways of relaxing, such as yoga, meditation, massage therapy, or acupuncture.  Exercise regularly.  Change situations that cause you stress. Try to keep your work and personal routine reasonable.  Do not abuse illegal drugs.  Limit alcohol intake to no more than 1 drink per day for nonpregnant women and 2 drinks per day for men. One drink equals 12 ounces of beer, 5 ounces of wine, or 1 ounces of hard liquor.  Take a multivitamin, if directed by your health care provider. Contact a health care provider if:  Your fatigue does not get better.  You have a fever.  You have unintentional weight loss or gain.  You have headaches.  You have difficulty:  Falling asleep.  Sleeping throughout the night.  You feel angry, guilty, anxious, or sad.  You are unable to have a bowel movement (constipation).  You skin is dry.  Your legs or another part of your body is swollen. Get help right away if:  You feel confused.  Your vision is blurry.  You feel faint or pass out.  You have a severe headache.  You have severe abdominal, pelvic, or back pain.  You have chest pain, shortness of breath, or an  irregular or fast heartbeat.  You are unable to urinate or you urinate less than normal.  You develop abnormal bleeding, such as bleeding from the rectum, vagina, nose, lungs, or nipples.  You vomit blood.  You have thoughts about harming yourself or committing suicide.  You are worried that you might harm someone else. This information is not intended to replace advice given to you by your health care provider. Make sure you discuss any questions you have with your health care provider. Document Released: 01/23/2007 Document Revised: 09/03/2015 Document Reviewed: 07/30/2013  2017 Elsevier  

## 2016-06-28 ENCOUNTER — Encounter: Payer: Self-pay | Admitting: *Deleted

## 2016-10-28 ENCOUNTER — Ambulatory Visit (INDEPENDENT_AMBULATORY_CARE_PROVIDER_SITE_OTHER): Payer: BLUE CROSS/BLUE SHIELD | Admitting: Family Medicine

## 2016-10-28 ENCOUNTER — Encounter: Payer: Self-pay | Admitting: Family Medicine

## 2016-10-28 ENCOUNTER — Ambulatory Visit
Admission: RE | Admit: 2016-10-28 | Discharge: 2016-10-28 | Disposition: A | Discharge status: Home / Self Care | Payer: BLUE CROSS/BLUE SHIELD | Source: Ambulatory Visit | Attending: Family Medicine | Admitting: Family Medicine

## 2016-10-28 ENCOUNTER — Telehealth: Payer: Self-pay

## 2016-10-28 VITALS — BP 124/88 | HR 88 | Temp 98.2°F | Wt 274.8 lb

## 2016-10-28 DIAGNOSIS — M25511 Pain in right shoulder: Secondary | ICD-10-CM

## 2016-10-28 MED ORDER — ETODOLAC 200 MG PO CAPS: 200.0000 mg | ORAL_CAPSULE | Freq: Three times a day (TID) | ORAL | 1 refills | Status: DC

## 2016-10-28 NOTE — Telephone Encounter (Signed)
-----   Message from Jodell Ciproennis E Sheffieldhrismon, [place] sent at 10/28/2016 12:28 PM EDT ----- Normal bones and articular surfaces without signs of dislocation. Suspect rotator cuff strain. Proceed with medication, ROM exercises and heat or ice applications as discussed. Recheck in 2 weeks if no better.

## 2016-10-28 NOTE — Telephone Encounter (Signed)
Pt returned call ° °teri °

## 2016-10-28 NOTE — Telephone Encounter (Signed)
Pt informed and voiced understanding of results. 

## 2016-10-28 NOTE — Progress Notes (Signed)
Patient: Ashlee Wilson Female    DOB: Apr 23, 1985   31 y.o.   MRN: [id number] Visit Date: 10/28/2016  Today's Provider: Dortha Kernennis Marialena Wollen, PA   Chief Complaint  Patient presents with  . Shoulder Pain   Subjective:    Shoulder Pain   The pain is present in the right shoulder. This is a new problem. Episode onset: Tuesday. The problem occurs constantly. The problem has been unchanged. The quality of the pain is described as burning. Exacerbated by: lifting baby out of play pen. She has tried NSAIDS for the symptoms. The treatment provided mild relief.   Patient Active Problem List   Diagnosis Date Noted  . Status post cesarean section 01/04/2016  . Supervision of high risk pregnancy in third trimester 01/02/2016  . Obesity complicating pregnancy, third trimester (CODE) 01/02/2016  . BMI 50.0-59.9, adult (HCC) 01/02/2016  . [redacted] weeks gestation of pregnancy 01/02/2016  . Labor and delivery, indication for care 01/02/2016  . Anxiety disorder 10/16/2014   Past Surgical History:  Procedure Laterality Date  . CESAREAN SECTION N/A 01/04/2016   Procedure: CESAREAN SECTION;  Surgeon: Conard NovakStephen D Jackson, MD;  Location: [facility] ORS;  Service: Obstetrics;  Laterality: N/A;  . GALLBLADDER SURGERY  2014  . LAPAROSCOPIC CHOLECYSTECTOMY N/A 2013  . MOUTH SURGERY N/A 2003-07   lower teeth removed, manually pulled adult teeth up over several years   Family History  Problem Relation Age of Onset  . Asthma Mother   . Hyperlipidemia Father   . Prostate cancer Father    Allergies  Allergen Reactions  . Benadryl [Diphenhydramine] Other (See Comments)    HR slowed, and BP dropped  . Oxycodone-Acetaminophen Other (See Comments)    Pt expressed that she received for mouth sx years ago- pain med was such- and pt became incapcitated  . Paroxetine   . Penicillins Other (See Comments)    Pt states occurred as a baby, does not know reaction, but avoids taking such   Previous Medications   No medications  on file   Review of Systems  Constitutional: Negative.   Respiratory: Negative.   Cardiovascular: Negative.   Musculoskeletal: Positive for arthralgias.    Social History  Substance Use Topics  . Smoking status: Former Smoker    Quit date: 04/16/2007  . Smokeless tobacco: Never Used  . Alcohol use No   Objective:   BP 124/88 (BP Location: Right Arm, Patient Position: Sitting, Cuff Size: Normal)   Pulse 88   Temp 98.2 F (36.8 C) (Oral)   Wt 274 lb 12.8 oz (124.6 kg)   LMP 10/21/2016   SpO2 98%   Breastfeeding? Unknown   BMI 47.17 kg/m   Physical Exam  Constitutional: She is oriented to person, place, and time. She appears well-developed and well-nourished. No distress.  HENT:  Head: Normocephalic and atraumatic.  Right Ear: Hearing normal.  Left Ear: Hearing normal.  Nose: Nose normal.  Eyes: Conjunctivae and lids are normal. Right eye exhibits no discharge. Left eye exhibits no discharge. No scleral icterus.  Neck: Neck supple.  Pulmonary/Chest: Effort normal. No respiratory distress.  Musculoskeletal: She exhibits tenderness.  Tenderness in the right anterior shoulder to test ROM. Sharper pains to reach behind back, internally rotate or abduct the humerus (reach behind head or waist).  Neurological: She is alert and oriented to person, place, and time.  Skin: Skin is intact. No lesion and no rash noted.  Psychiatric: She has a normal mood and affect. Her speech is normal  and behavior is normal. Thought content normal.      Assessment & Plan:     1. Acute pain of right shoulder Onset Tuesday after lifting 20-24 lb daughter. Felt a burning pain in the right shoulder. Pains occur in the anterior and posterior shoulder. Suspect rotator cuff strain. Will get x-ray to rule out any bony abnormality or sign of subluxation. Treat with moist heat or ice packs and ROM exercises. Limit lifting to 10 lbs intermittently. Recheck pending x-ray report. - DG Shoulder Right - etodolac  (LODINE) 200 MG capsule; Take 1 capsule (200 mg total) by mouth every 8 (eight) hours.  Dispense: 30 capsule; Refill: 1

## 2016-10-28 NOTE — Patient Instructions (Signed)

## 2016-10-28 NOTE — Telephone Encounter (Signed)
LMTCB

## 2017-02-10 ENCOUNTER — Ambulatory Visit
Admission: RE | Admit: 2017-02-10 | Discharge: 2017-02-10 | Disposition: A | Discharge status: Home / Self Care | Payer: BLUE CROSS/BLUE SHIELD | Source: Ambulatory Visit | Attending: Physician Assistant | Admitting: Physician Assistant

## 2017-02-10 ENCOUNTER — Ambulatory Visit (INDEPENDENT_AMBULATORY_CARE_PROVIDER_SITE_OTHER): Payer: BLUE CROSS/BLUE SHIELD | Admitting: Physician Assistant

## 2017-02-10 ENCOUNTER — Encounter: Payer: Self-pay | Admitting: Physician Assistant

## 2017-02-10 VITALS — BP 112/64 | HR 84 | Temp 98.5°F | Resp 16

## 2017-02-10 DIAGNOSIS — M79672 Pain in left foot: Secondary | ICD-10-CM

## 2017-02-10 DIAGNOSIS — M25572 Pain in left ankle and joints of left foot: Secondary | ICD-10-CM | POA: Insufficient documentation

## 2017-02-10 NOTE — Progress Notes (Signed)
Patient: Ashlee Wilson Female    DOB: 02/05/1986   31 y.o.   MRN: [id number] Visit Date: 02/10/2017  Today's Provider: Trey Sailors, PA-C   Chief Complaint  Patient presents with  . Ankle Pain    Right side; happened yesterday   Subjective:    Ashlee Wilson is a 31 y/o woman presenting with left ankle pain after inversion injury yesterday. Unable to bear weight after injury and difficulty bearing weight now.  Ankle Pain   The incident occurred 12 to 24 hours ago. The incident occurred at work. The injury mechanism was a twisting injury. The pain is present in the left ankle. The pain is at a severity of 4/10 (10/10 when putting weight on it. ). The pain is severe. The pain has been constant since onset. Associated symptoms include an inability to bear weight, a loss of motion and numbness (Last two toes.). Pertinent negatives include no loss of sensation, muscle weakness or tingling. She reports no foreign bodies present. She has tried ice and NSAIDs for the symptoms. The treatment provided no relief.       Allergies  Allergen Reactions  . Benadryl [Diphenhydramine] Other (See Comments)    HR slowed, and BP dropped  . Oxycodone-Acetaminophen Other (See Comments)    Pt expressed that she received for mouth sx years ago- pain med was such- and pt became incapcitated  . Paroxetine   . Penicillins Other (See Comments)    Pt states occurred as a baby, does not know reaction, but avoids taking such    No current outpatient prescriptions on file.  Review of Systems  Constitutional: Negative.   Musculoskeletal: Positive for arthralgias and joint swelling. Negative for back pain, gait problem, myalgias, neck pain and neck stiffness.  Neurological: Positive for numbness (Last two toes.). Negative for dizziness, tingling, light-headedness and headaches.    Social History  Substance Use Topics  . Smoking status: Former Smoker    Quit date: 04/16/2007  . Smokeless  tobacco: Never Used  . Alcohol use No   Objective:   BP 112/64 (BP Location: Left Arm, Patient Position: Sitting, Cuff Size: Large)   Pulse 84   Temp 98.5 F (36.9 C) (Oral)   Resp 16   LMP 01/18/2017  Vitals:   02/10/17 1107  BP: 112/64  Pulse: 84  Resp: 16  Temp: 98.5 F (36.9 C)  TempSrc: Oral     Physical Exam  Constitutional: She is oriented to person, place, and time. She appears well-developed and well-nourished.  Musculoskeletal: She exhibits edema and tenderness. She exhibits no deformity.       Right ankle: Normal.       Left ankle: She exhibits decreased range of motion and swelling. She exhibits no ecchymosis, no deformity, no laceration and normal pulse. Tenderness. Lateral malleolus tenderness found. No medial malleolus, no head of 5th metatarsal and no proximal fibula tenderness found.       Right foot: Normal.       Left foot: There is decreased range of motion and swelling.  Neurological: She is alert and oriented to person, place, and time.  Skin: Skin is warm and dry.  Psychiatric: She has a normal mood and affect. Her behavior is normal.        Assessment & Plan:     1. Acute left ankle pain  RICE. Non weight bearing until xrays. Will direct further at that point. NSAIDs.   - DG Ankle  Complete Left; Future  2. Left foot pain  - DG Foot Complete Left; Future  Return if symptoms worsen or fail to improve.  The entirety of the information documented in the History of Present Illness, Review of Systems and Physical Exam were personally obtained by me. Portions of this information were initially documented by Kavin LeechLaura Walsh, CMA and reviewed by me for thoroughness and accuracy.        Trey SailorsAdriana M Pollak, PA-C  [facility]

## 2017-02-10 NOTE — Patient Instructions (Signed)
Ankle Pain Many things can cause ankle pain, including an injury to the area and overuse of the ankle.The ankle joint holds your body weight and allows you to move around. Ankle pain can occur on either side or the back of one ankle or both ankles. Ankle pain may be sharp and burning or dull and aching. There may be tenderness, stiffness, redness, or warmth around the ankle. Follow these instructions at home: Activity  Rest your ankle as told by your health care provider. Avoid any activities that cause ankle pain.  Do exercises as told by your health care provider.  Ask your health care provider if you can drive. Using a brace, a bandage, or crutches  If you were given a brace: ? Wear it as told by your health care provider. ? Remove it when you take a bath or a shower. ? Try not to move your ankle very much, but wiggle your toes from time to time. This helps to prevent swelling.  If you were given an elastic bandage: ? Remove it when you take a bath or a shower. ? Try not to move your ankle very much, but wiggle your toes from time to time. This helps to prevent swelling. ? Adjust the bandage to make it more comfortable if it feels too tight. ? Loosen the bandage if you have numbness or tingling in your foot or if your foot turns cold and blue.  If you have crutches, use them as told by your health care provider. Continue to use them until you can walk without feeling pain in your ankle. Managing pain, stiffness, and swelling  Raise (elevate) your ankle above the level of your heart while you are sitting or lying down.  If directed, apply ice to the area: ? Put ice in a plastic bag. ? Place a towel between your skin and the bag. ? Leave the ice on for 20 minutes, 2-3 times per day. General instructions  Keep all follow-up visits as told by your health care provider. This is important.  Record this information that may be helpful for you and your health care provider: ? How  often you have ankle pain. ? Where the pain is located. ? What the pain feels like.  Take over-the-counter and prescription medicines only as told by your health care provider. Contact a health care provider if:  Your pain gets worse.  Your pain is not relieved with medicines.  You have a fever or chills.  You are having more trouble with walking.  You have new symptoms. Get help right away if:  Your foot, leg, toes, or ankle tingles or becomes numb.  Your foot, leg, toes, or ankle becomes swollen.  Your foot, leg, toes, or ankle turns pale or blue. This information is not intended to replace advice given to you by your health care provider. Make sure you discuss any questions you have with your health care provider. Document Released: 09/15/2009 Document Revised: 11/27/2015 Document Reviewed: 10/28/2014 Elsevier Interactive Patient Education  2017 Elsevier Inc.  

## 2017-05-25 ENCOUNTER — Ambulatory Visit: Payer: BC Managed Care – PPO | Admitting: Obstetrics & Gynecology

## 2017-05-29 ENCOUNTER — Ambulatory Visit: Payer: BC Managed Care – PPO | Admitting: Obstetrics & Gynecology

## 2017-10-23 ENCOUNTER — Ambulatory Visit: Payer: BLUE CROSS/BLUE SHIELD | Admitting: Family Medicine

## 2017-10-23 ENCOUNTER — Encounter: Payer: Self-pay | Admitting: Family Medicine

## 2017-10-23 VITALS — BP 124/80 | HR 94 | Temp 98.7°F | Resp 16 | Wt 267.2 lb

## 2017-10-23 DIAGNOSIS — S39012A Strain of muscle, fascia and tendon of lower back, initial encounter: Secondary | ICD-10-CM

## 2017-10-23 MED ORDER — CYCLOBENZAPRINE HCL 5 MG PO TABS
5.0000 mg | ORAL_TABLET | Freq: Three times a day (TID) | ORAL | 0 refills | Status: DC | PRN
Start: 2017-10-23 — End: 2017-12-14

## 2017-10-23 NOTE — Patient Instructions (Addendum)
Discussed increasing ibuprofen 600-800 mg 3 x day with food. May also add Tylenol up to 3000 mg/day. Let me know if not continuing to improve over the next week or two.

## 2017-10-23 NOTE — Progress Notes (Signed)
  Subjective:     Patient ID: Ashlee Wilson, female   DOB: 20-May-1985, 32 y.o.   MRN: [id number] Chief Complaint  Patient presents with  . Back Pain    Patient comes in office today with complaints of lower back pain mainly on left side for one week. Patient states that a week ago she was bending [place] squatting at beach and when she tried standing she immediatley had a harp pain in her back. Patient states that when she lays flat on her back she does not have pain but has difficulty with sitting, patient has been taking Ibprofen PRN.    HPI States her back was hurting on the left side  for a couple of weeks. Now she feels it across her back- L>R- with occasional spasms when turning in bed. 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  Review of Systems     Objective:   Physical Exam  Constitutional: She appears well-developed and well-nourished. No distress (standing on presentation).  Musculoskeletal:  Muscle strength in lower extremities 5/5. SLR's to 90 degrees without radiation of back pain. Localizes primarily to left lower lumbar paravertebral area-Mildly tender.       Assessment:    1. Strain of lumbar region, initial encounter - cyclobenzaprine (FLEXERIL) 5 MG tablet; Take 1 tablet (5 mg total) by mouth 3 (three) times daily as needed for muscle spasms.  Dispense: 21 tablet; Refill: 0    Plan:    Discussed rational use of nsaid's and tylenol. Discussed red flag sx with her.

## 2017-12-14 ENCOUNTER — Ambulatory Visit: Payer: BLUE CROSS/BLUE SHIELD | Admitting: Family Medicine

## 2017-12-14 ENCOUNTER — Encounter: Payer: Self-pay | Admitting: Family Medicine

## 2017-12-14 VITALS — BP 110/76 | HR 84 | Temp 98.7°F | Resp 16 | Wt 267.2 lb

## 2017-12-14 DIAGNOSIS — N938 Other specified abnormal uterine and vaginal bleeding: Secondary | ICD-10-CM | POA: Diagnosis not present

## 2017-12-14 MED ORDER — FLUOXETINE HCL 10 MG PO TABS: 10.0000 mg | ORAL_TABLET | Freq: Every day | ORAL | 1 refills | Status: DC

## 2017-12-14 MED ORDER — PAROXETINE HCL 10 MG PO TABS: 10.0000 mg | ORAL_TABLET | Freq: Every day | ORAL | 1 refills | Status: DC

## 2017-12-14 NOTE — Progress Notes (Signed)
  Subjective:     Patient ID: Ashlee Wilson, female   DOB: 12/31/1985, 32 y.o.   MRN: [id number] Chief Complaint  Patient presents with  . Menstrual Problem    Patient comes in office today to discuss heavy menstrual cycles for the past two years. Patient states that she has a cycle once a month last between 7-8 days, 5 out of the days patient reports that bleeding is very heavy. Patient states that she ususally has to change her overnight pads and diva cup every 30-39min. Patient reports PMS symptoms have worsened as well and reports being easily agitated and agressive. Patient also complaints of frequent headaches, nausea on cycle and has concerns of anemia   HPI States her periods are regular but very heavy and associated with premenstrual symptoms and fatigue. No birth control but has not been sexually active for two months.  Review of Systems     Objective:   Physical Exam  Constitutional: She appears well-developed and well-nourished. No distress.  Abdominal: Soft. There is no tenderness.       Assessment:    1. Dysfunctional uterine bleeding - CBC with Differential/Platelet - US Pelvis Complete; Future - US Transvaginal Non-OB; Future    Plan:    Further f/u pending lab and study results. Consider BCP's and/or gyn referral.

## 2017-12-14 NOTE — Patient Instructions (Signed)
We will call with the lab results and the ultrasound schedule.

## 2017-12-15 ENCOUNTER — Telehealth: Payer: Self-pay

## 2017-12-15 LAB — CBC WITH DIFFERENTIAL/PLATELET
BASOS: 0 %
Basophils Absolute: 0 10*3/uL (ref 0.0–0.2)
EOS (ABSOLUTE): 0 10*3/uL (ref 0.0–0.4)
EOS: 1 %
HEMATOCRIT: 40.4 % (ref 34.0–46.6)
HEMOGLOBIN: 13.4 g/dL (ref 11.1–15.9)
IMMATURE GRANS (ABS): 0 10*3/uL (ref 0.0–0.1)
IMMATURE GRANULOCYTES: 0 %
LYMPHS: 24 %
Lymphocytes Absolute: 2.1 10*3/uL (ref 0.7–3.1)
MCH: 26.9 pg (ref 26.6–33.0)
MCHC: 33.2 g/dL (ref 31.5–35.7)
MCV: 81 fL (ref 79–97)
MONOCYTES: 6 %
Monocytes Absolute: 0.5 10*3/uL (ref 0.1–0.9)
NEUTROS PCT: 69 %
Neutrophils Absolute: 6 10*3/uL (ref 1.4–7.0)
PLATELETS: 307 10*3/uL (ref 150–450)
RBC: 4.98 x10E6/uL (ref 3.77–5.28)
RDW: 13.9 % (ref 12.3–15.4)
WBC: 8.6 10*3/uL (ref 3.4–10.8)

## 2017-12-15 NOTE — Telephone Encounter (Signed)
-----   Message from Anola Gurney, [place] sent at 12/15/2017  7:31 AM EDT ----- No anemia

## 2017-12-15 NOTE — Telephone Encounter (Signed)
[id number] 

## 2017-12-20 ENCOUNTER — Telehealth: Payer: Self-pay | Admitting: Family Medicine

## 2017-12-20 ENCOUNTER — Ambulatory Visit: Payer: BLUE CROSS/BLUE SHIELD | Attending: Family Medicine

## 2017-12-20 NOTE — Telephone Encounter (Signed)
Pt didn't show up for her imaging study.

## 2017-12-21 NOTE — Telephone Encounter (Signed)
[id number] 

## 2017-12-21 NOTE — Telephone Encounter (Signed)
FYI. KW 

## 2017-12-25 NOTE — Telephone Encounter (Signed)
After multiple attempts to reach patient will mail letter home today. KW

## 2018-02-15 ENCOUNTER — Encounter: Payer: Self-pay | Admitting: Physician Assistant

## 2018-02-15 ENCOUNTER — Ambulatory Visit (INDEPENDENT_AMBULATORY_CARE_PROVIDER_SITE_OTHER): Payer: BLUE CROSS/BLUE SHIELD | Admitting: Physician Assistant

## 2018-02-15 VITALS — BP 122/84 | HR 86 | Temp 98.0°F | Wt 267.6 lb

## 2018-02-15 DIAGNOSIS — J0100 Acute maxillary sinusitis, unspecified: Secondary | ICD-10-CM

## 2018-02-15 MED ORDER — DOXYCYCLINE HYCLATE 100 MG PO TABS: 100.0000 mg | ORAL_TABLET | Freq: Two times a day (BID) | ORAL | 0 refills | Status: AC

## 2018-02-15 NOTE — Progress Notes (Signed)
Patient: Ashlee Wilson Female    DOB: 1986-03-06   32 y.o.   MRN: [id number] Visit Date: 02/15/2018  Today's Provider: Trey Sailors, PA-C   Chief Complaint  Patient presents with  . URI   Subjective:    HPI  Upper Respiratory Infection Patient presents today for URI symptoms for 2 weeks. Patient states that she had runny nose, productive cough, fever (02/14/2018),headache and tired. Patient has taking Tylenol head and cold medication. She felt like she got better and then got worse. Denies chest pain and SOB. Period ended 2 days ago, she denies pregnancy.     Allergies  Allergen Reactions  . Benadryl [Diphenhydramine] Other (See Comments)    HR slowed, and BP dropped  . Oxycodone-Acetaminophen Other (See Comments)    Pt expressed that she received for mouth sx years ago- pain med was such- and pt became incapcitated  . Paroxetine   . Penicillins Other (See Comments)    Pt states occurred as a baby, does not know reaction, but avoids taking such     Current Outpatient Medications:  .  FLUoxetine (PROZAC) 10 MG tablet, Take 1 tablet (10 mg total) by mouth daily. (Patient not taking: Reported on 02/15/2018), Disp: 30 tablet, Rfl: 1  Review of Systems  Constitutional: Positive for fatigue and fever.  HENT: Positive for congestion and sneezing.   Respiratory: Positive for cough.   Gastrointestinal: Negative.   Genitourinary: Negative.   Neurological: Positive for headaches.  Psychiatric/Behavioral: Negative.     Social History   Tobacco Use  . Smoking status: Former Smoker    Last attempt to quit: 04/16/2007    Years since quitting: 10.8  . Smokeless tobacco: Never Used  Substance Use Topics  . Alcohol use: No    Alcohol/week: 0.0 standard drinks   Objective:   BP 122/84 (BP Location: Right Arm, Patient Position: Sitting, Cuff Size: Normal)   Pulse 86   Temp 98 F (36.7 C) (Oral)   Wt 267 lb 9.6 oz (121.4 kg)   LMP 02/05/2018   SpO2 97%   BMI 45.93  kg/m  Vitals:   02/15/18 1208  BP: 122/84  Pulse: 86  Temp: 98 F (36.7 C)  TempSrc: Oral  SpO2: 97%  Weight: 267 lb 9.6 oz (121.4 kg)     Physical Exam  Constitutional: She is oriented to person, place, and time. She appears well-developed and well-nourished. No distress.  HENT:  Right Ear: External ear normal.  Left Ear: External ear normal.  Nose: Right sinus exhibits maxillary sinus tenderness and frontal sinus tenderness. Left sinus exhibits maxillary sinus tenderness and frontal sinus tenderness.  Mouth/Throat: Oropharynx is clear and moist. No oropharyngeal exudate, posterior oropharyngeal edema or posterior oropharyngeal erythema.  Tms opaque bilaterally   Eyes: Conjunctivae are normal. Right eye exhibits no discharge. Left eye exhibits no discharge.  Neck: Neck supple.  Cardiovascular: Normal rate and regular rhythm.  Pulmonary/Chest: Effort normal and breath sounds normal.  Lymphadenopathy:    She has cervical adenopathy.  Neurological: She is alert and oriented to person, place, and time.  Skin: Skin is warm and dry. She is not diaphoretic.  Psychiatric: She has a normal mood and affect. Her behavior is normal.        Assessment & Plan:     1. Acute non-recurrent maxillary sinusitis  - doxycycline (VIBRA-TABS) 100 MG tablet; Take 1 tablet (100 mg total) by mouth 2 (two) times daily for 7 days.  Dispense: 14 tablet; Refill: 0  Return if symptoms worsen or fail to improve.  The entirety of the information documented in the History of Present Illness, Review of Systems and Physical Exam were personally obtained by me. Portions of this information were initially documented by Fonnie Birkenhead, CMA and reviewed by me for thoroughness and accuracy.            Trey Sailors, PA-C  [facility]

## 2018-02-15 NOTE — Patient Instructions (Signed)

## 2018-05-21 ENCOUNTER — Other Ambulatory Visit: Payer: Self-pay | Admitting: Family Medicine

## 2018-05-21 DIAGNOSIS — Z20828 Contact with and (suspected) exposure to other viral communicable diseases: Secondary | ICD-10-CM

## 2018-05-21 MED ORDER — OSELTAMIVIR PHOSPHATE 75 MG PO CAPS
75.0000 mg | ORAL_CAPSULE | Freq: Every day | ORAL | 0 refills | Status: DC
Start: 2018-05-21 — End: 2019-01-17

## 2018-05-21 NOTE — Progress Notes (Signed)
Husband diagnosed with influenza A. No symptoms yet. Will treat with prophylactic Tamiflu.

## 2019-01-17 ENCOUNTER — Encounter: Payer: Self-pay | Admitting: Physician Assistant

## 2019-01-17 ENCOUNTER — Telehealth (INDEPENDENT_AMBULATORY_CARE_PROVIDER_SITE_OTHER): Payer: BLUE CROSS/BLUE SHIELD | Admitting: Physician Assistant

## 2019-01-17 ENCOUNTER — Other Ambulatory Visit: Payer: Self-pay

## 2019-01-17 VITALS — Temp 97.5°F | Ht 65.0 in | Wt 260.0 lb

## 2019-01-17 DIAGNOSIS — H9203 Otalgia, bilateral: Secondary | ICD-10-CM

## 2019-01-17 DIAGNOSIS — H6983 Other specified disorders of Eustachian tube, bilateral: Secondary | ICD-10-CM | POA: Diagnosis not present

## 2019-01-17 DIAGNOSIS — J0100 Acute maxillary sinusitis, unspecified: Secondary | ICD-10-CM | POA: Diagnosis not present

## 2019-01-17 MED ORDER — DOXYCYCLINE HYCLATE 100 MG PO TABS: 100.0000 mg | ORAL_TABLET | Freq: Two times a day (BID) | ORAL | 0 refills | Status: DC

## 2019-01-17 MED ORDER — PREDNISONE 20 MG PO TABS: 20.0000 mg | ORAL_TABLET | Freq: Every day | ORAL | 0 refills | Status: DC

## 2019-01-17 NOTE — Patient Instructions (Signed)
Sinusitis, Adult Sinusitis is inflammation of your sinuses. Sinuses are hollow spaces in the bones around your face. Your sinuses are located:  Around your eyes.  In the middle of your forehead.  Behind your nose.  In your cheekbones. Mucus normally drains out of your sinuses. When your nasal tissues become inflamed or swollen, mucus can become trapped or blocked. This allows bacteria, viruses, and fungi to grow, which leads to infection. Most infections of the sinuses are caused by a virus. Sinusitis can develop quickly. It can last for up to 4 weeks (acute) or for more than 12 weeks (chronic). Sinusitis often develops after a cold. What are the causes? This condition is caused by anything that creates swelling in the sinuses or stops mucus from draining. This includes:  Allergies.  Asthma.  Infection from bacteria or viruses.  Deformities or blockages in your nose or sinuses.  Abnormal growths in the nose (nasal polyps).  Pollutants, such as chemicals or irritants in the air.  Infection from fungi (rare). What increases the risk? You are more likely to develop this condition if you:  Have a weak body defense system (immune system).  Do a lot of swimming or diving.  Overuse nasal sprays.  Smoke. What are the signs or symptoms? The main symptoms of this condition are pain and a feeling of pressure around the affected sinuses. Other symptoms include:  Stuffy nose or congestion.  Thick drainage from your nose.  Swelling and warmth over the affected sinuses.  Headache.  Upper toothache.  A cough that may get worse at night.  Extra mucus that collects in the throat or the back of the nose (postnasal drip).  Decreased sense of smell and taste.  Fatigue.  A fever.  Sore throat.  Bad breath. How is this diagnosed? This condition is diagnosed based on:  Your symptoms.  Your medical history.  A physical exam.  Tests to find out if your condition is  acute or chronic. This may include: ? Checking your nose for nasal polyps. ? Viewing your sinuses using a device that has a light (endoscope). ? Testing for allergies or bacteria. ? Imaging tests, such as an MRI or CT scan. In rare cases, a bone biopsy may be done to rule out more serious types of fungal sinus disease. How is this treated? Treatment for sinusitis depends on the cause and whether your condition is chronic or acute.  If caused by a virus, your symptoms should go away on their own within 10 days. You may be given medicines to relieve symptoms. They include: ? Medicines that shrink swollen nasal passages (topical intranasal decongestants). ? Medicines that treat allergies (antihistamines). ? A spray that eases inflammation of the nostrils (topical intranasal corticosteroids). ? Rinses that help get rid of thick mucus in your nose (nasal saline washes).  If caused by bacteria, your health care provider may recommend waiting to see if your symptoms improve. Most bacterial infections will get better without antibiotic medicine. You may be given antibiotics if you have: ? A severe infection. ? A weak immune system.  If caused by narrow nasal passages or nasal polyps, you may need to have surgery. Follow these instructions at home: Medicines  Take, use, or apply over-the-counter and prescription medicines only as told by your health care provider. These may include nasal sprays.  If you were prescribed an antibiotic medicine, take it as told by your health care provider. Do not stop taking the antibiotic even if you start   to feel better. Hydrate and humidify   Drink enough fluid to keep your urine pale yellow. Staying hydrated will help to thin your mucus.  Use a cool mist humidifier to keep the humidity level in your home above 50%.  Inhale steam for 10-15 minutes, 3-4 times a day, or as told by your health care provider. You can do this in the bathroom while a hot shower is  running.  Limit your exposure to cool or dry air. Rest  Rest as much as possible.  Sleep with your head raised (elevated).  Make sure you get enough sleep each night. General instructions   Apply a warm, moist washcloth to your face 3-4 times a day or as told by your health care provider. This will help with discomfort.  Wash your hands often with soap and water to reduce your exposure to germs. If soap and water are not available, use hand sanitizer.  Do not smoke. Avoid being around people who are smoking (secondhand smoke).  Keep all follow-up visits as told by your health care provider. This is important. Contact a health care provider if:  You have a fever.  Your symptoms get worse.  Your symptoms do not improve within 10 days. Get help right away if:  You have a severe headache.  You have persistent vomiting.  You have severe pain or swelling around your face or eyes.  You have vision problems.  You develop confusion.  Your neck is stiff.  You have trouble breathing. Summary  Sinusitis is soreness and inflammation of your sinuses. Sinuses are hollow spaces in the bones around your face.  This condition is caused by nasal tissues that become inflamed or swollen. The swelling traps or blocks the flow of mucus. This allows bacteria, viruses, and fungi to grow, which leads to infection.  If you were prescribed an antibiotic medicine, take it as told by your health care provider. Do not stop taking the antibiotic even if you start to feel better.  Keep all follow-up visits as told by your health care provider. This is important. This information is not intended to replace advice given to you by your health care provider. Make sure you discuss any questions you have with your health care provider. Document Released: 03/28/2005 Document Revised: 08/28/2017 Document Reviewed: 08/28/2017 Elsevier Patient Education  2020 [employer]  

## 2019-01-17 NOTE — Progress Notes (Signed)
Patient: Ashlee Wilson Female    DOB: [birth year]   33 y.o.   MRN: [id number] Visit Date: 01/17/2019  Today's Provider: Margaretann Loveless, PA-C   Chief Complaint  Patient presents with  . URI   Subjective:    I,Joseline E. Rosas,RMA am acting as a Neurosurgeon for [employer], PA-C.   Virtual Visit via Video Note  I connected with [name] on 01/17/19 at  1:20 PM EDT by a video enabled telemedicine application and verified that I am speaking with the correct person using two identifiers.  Location: Patient: home Provider: home office   I discussed the limitations of evaluation and management by telemedicine and the availability of in person appointments. The patient expressed understanding and agreed to proceed.   URI  This is a new problem. The current episode started in the past 7 days (started 3 days ago and today she feels worse). The problem has been gradually worsening. There has been no fever. Associated symptoms include congestion, ear pain (Bilateral, and pressure), rhinorrhea and sneezing. Pertinent negatives include no chest pain, diarrhea, headaches, vomiting or wheezing. She has tried increased fluids (Sudafed,last night Mucinex) for the symptoms.  Reports she has been quarantining for the last 3 weeks due to a possible outbreak at her daughter's school. Does not feel this is covid-19 related.    Allergies  Allergen Reactions  . Benadryl [Diphenhydramine] Other (See Comments)    HR slowed, and BP dropped  . Oxycodone-Acetaminophen Other (See Comments)    Pt expressed that she received for mouth sx years ago- pain med was such- and pt became incapcitated  . Paroxetine   . Penicillins Other (See Comments)    Pt states occurred as a baby, does not know reaction, but avoids taking such     Current Outpatient Medications:  .  FLUoxetine (PROZAC) 10 MG tablet, Take 1 tablet (10 mg total) by mouth daily. (Patient not taking: Reported on  02/15/2018), Disp: 30 tablet, Rfl: 1  Review of Systems  Constitutional: Positive for fatigue. Negative for chills and fever.  HENT: Positive for congestion, ear pain (Bilateral, and pressure), postnasal drip, rhinorrhea, sinus pressure and sneezing.   Respiratory: Negative for chest tightness, shortness of breath and wheezing.   Cardiovascular: Negative for chest pain, palpitations and leg swelling.  Gastrointestinal: Negative for diarrhea and vomiting.  Neurological: Negative for dizziness, light-headedness and headaches.    Social History   Tobacco Use  . Smoking status: Former Smoker    Quit date: 04/16/2007    Years since quitting: 11.7  . Smokeless tobacco: Never Used  Substance Use Topics  . Alcohol use: No    Alcohol/week: 0.0 standard drinks      Objective:   Temp (!) 97.5 F (36.4 C) (Oral)   Ht 5\' 5"  (1.651 m)   Wt 260 lb (117.9 kg)   BMI 43.27 kg/m  Vitals:   01/17/19 1058  Temp: (!) 97.5 F (36.4 C)  TempSrc: Oral  Weight: 260 lb (117.9 kg)  Height: 5\' 5"  (1.651 m)  Body mass index is 43.27 kg/m.   Physical Exam Vitals signs reviewed.  Constitutional:      General: She is not in acute distress.    Appearance: Normal appearance. She is well-developed. She is not ill-appearing.  HENT:     Head: Normocephalic and atraumatic.     Ears:     Comments: Reports ear pain, tenderness around ears externally to touch and  maxillary tenderness Neck:     Musculoskeletal: Normal range of motion and neck supple.  Pulmonary:     Effort: Pulmonary effort is normal. No respiratory distress.  Neurological:     Mental Status: She is alert.  Psychiatric:        Mood and Affect: Mood normal.        Behavior: Behavior normal.        Thought Content: Thought content normal.        Judgment: Judgment normal.      No results found for any visits on 01/17/19.     Assessment & Plan    1. Acute non-recurrent maxillary sinusitis Worsening symptoms that have not  responded to OTC medications. Will give Doxycycline as below. Continue allergy medications. Stay well hydrated and get plenty of rest. Call if no symptom improvement or if symptoms worsen. - doxycycline (VIBRA-TABS) 100 MG tablet; Take 1 tablet (100 mg total) by mouth 2 (two) times daily.  Dispense: 14 tablet; Refill: 0  2. Dysfunction of both eustachian tubes Suspect possible ETD vs early infection. Doxycycline given above for infection and prednisone will be used as below for possible ETD as it has not been relieved with sudafed.  - predniSONE (DELTASONE) 20 MG tablet; Take 1 tablet (20 mg total) by mouth daily with breakfast.  Dispense: 5 tablet; Refill: 0  3. Ear pain, bilateral See above medical treatment plan. - predniSONE (DELTASONE) 20 MG tablet; Take 1 tablet (20 mg total) by mouth daily with breakfast.  Dispense: 5 tablet; Refill: 0    I discussed the assessment and treatment plan with the patient. The patient was provided an opportunity to ask questions and all were answered. The patient agreed with the plan and demonstrated an understanding of the instructions.   The patient was advised to call back or seek an in-person evaluation if the symptoms worsen or if the condition fails to improve as anticipated.  I provided 11 minutes of non-face-to-face time during this encounter    Mar Daring, PA-C  [employer]

## 2019-01-22 ENCOUNTER — Telehealth: Payer: Self-pay | Admitting: Family Medicine

## 2019-01-22 NOTE — Telephone Encounter (Signed)
Please review for [name].   Thanks,   -Pegah Segel  

## 2019-01-22 NOTE — Telephone Encounter (Signed)
Sinus pressure now has settled in chest - not productive cough and burns - not coughing  Pressure in ears have subsided - no pain. Pressure is still there. No fever. Very fatigued at the end of the day. Not sleeping well.  Has one more antibiotic pill to take.  Please call pt back to advise at:  [phone].  Thanks, [employer]

## 2019-01-23 NOTE — Telephone Encounter (Signed)
Sounds like she is in the recovery phase of her sinusitis when everything is draining.  Can use flonase to help with any residual congestion.  No additional abx needed

## 2019-01-23 NOTE — Telephone Encounter (Signed)
Patient advised as below. Patient verbalizes understanding and is in agreement with treatment plan.  

## 2019-04-12 IMAGING — CR DG SHOULDER 2+V*R*
1 series · 3 of 3 positions shown · non-contrast
Comparison: None.

CLINICAL DATA: Right shoulder pain and burning after picking upper
child on [REDACTED]. No previous injury.

EXAM:
RIGHT SHOULDER - 2+ VIEW

[Series 1: dg shoulder right · 0.14mm/px · 3 of 3 slices shown]
[im 1/3]
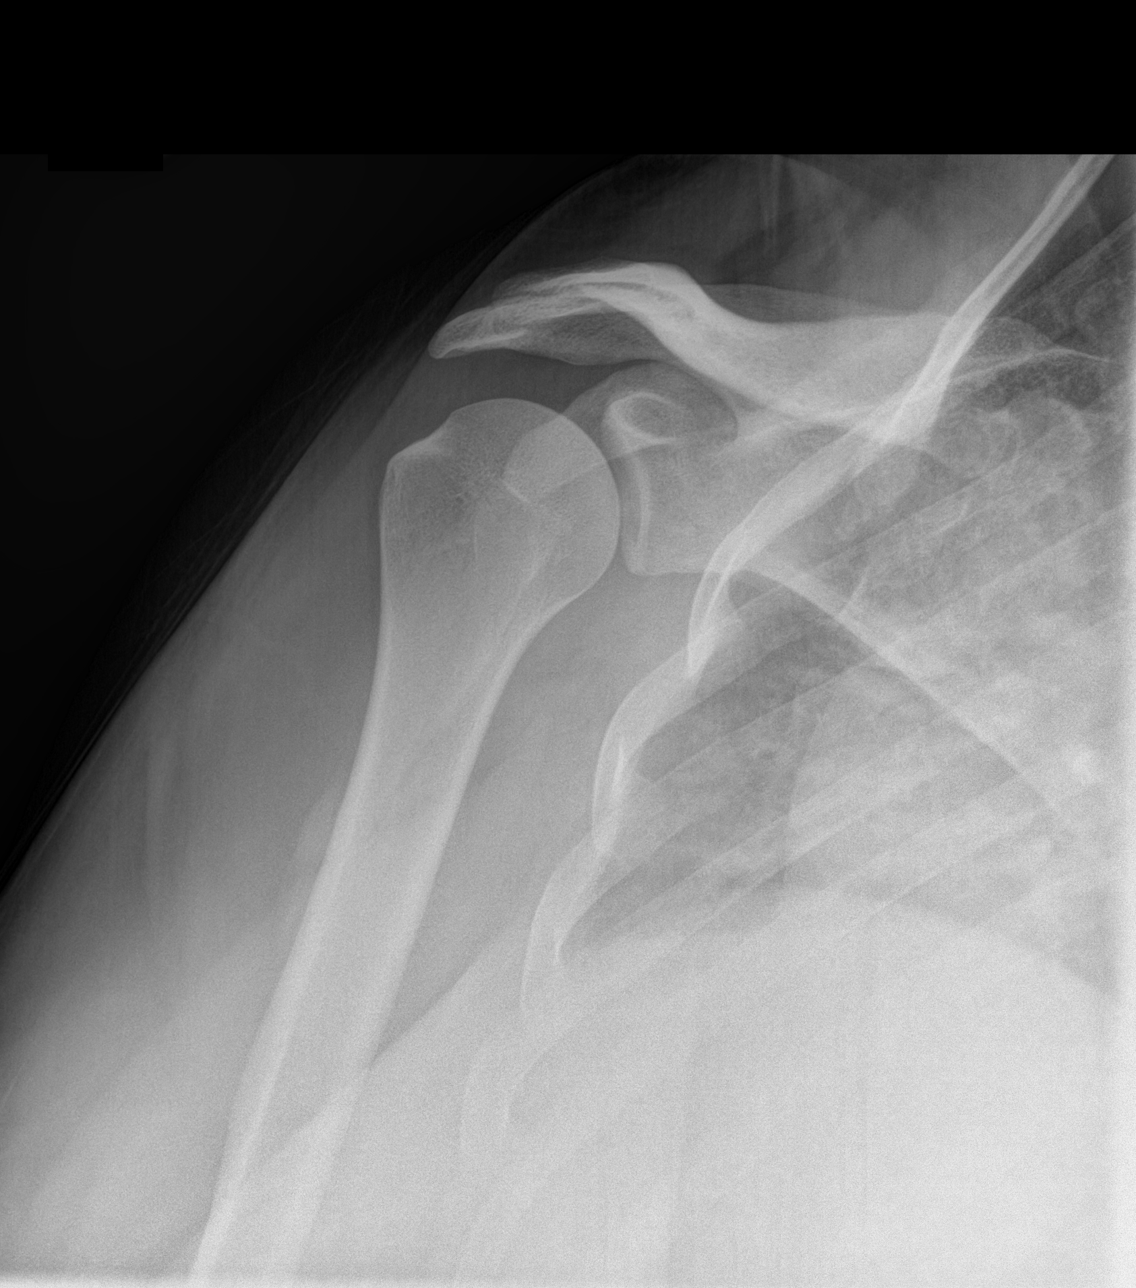
[im 2/3]
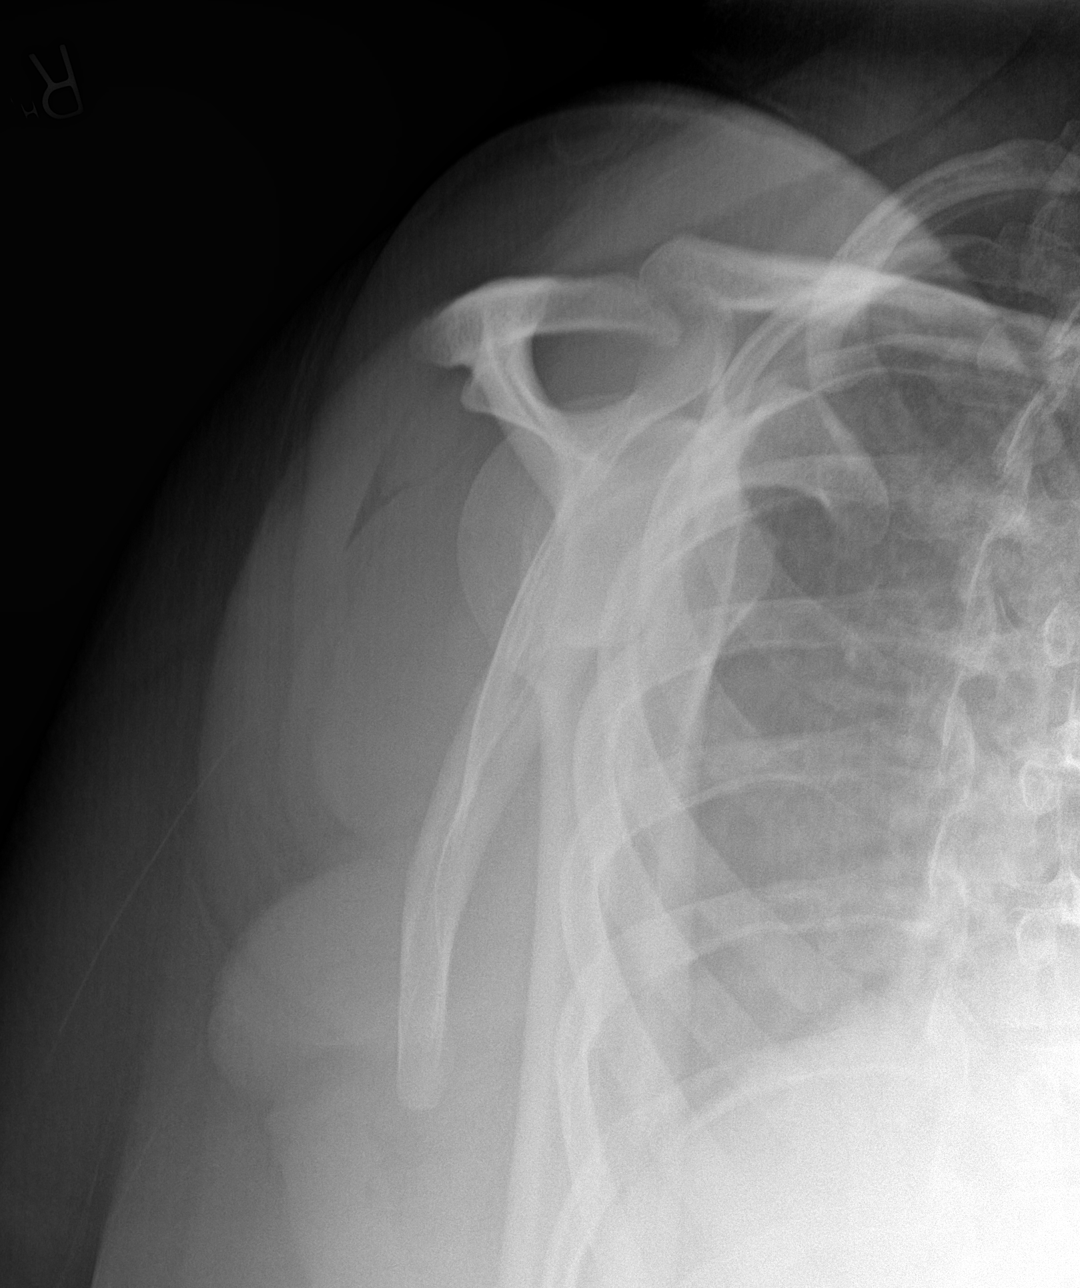
[im 3/3]
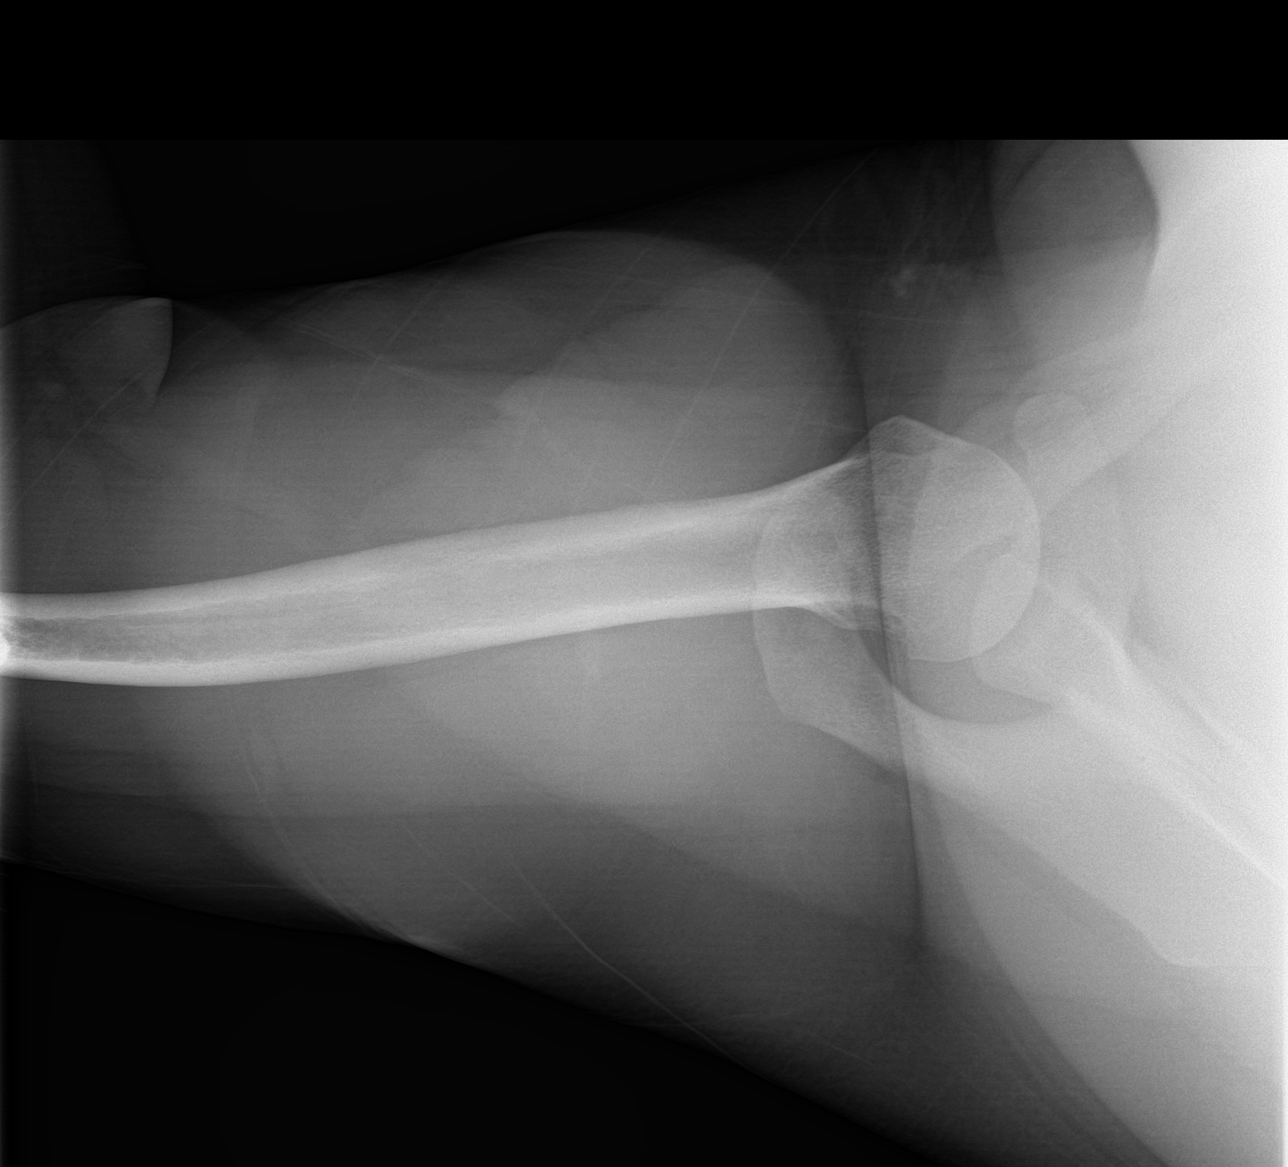

[3 of 3 positions shown; findings below may reference images not displayed]

FINDINGS: No fracture or dislocation. Glenohumeral and acromioclavicular joint
spaces are preserved. No evidence of calcific tendinitis. Regional
soft tissues appear normal. Limited visualization of the adjacent
thorax is normal.
IMPRESSION: Normal radiographs of the right shoulder.

## 2019-07-26 IMAGING — CR DG ANKLE COMPLETE 3+V*L*
1 series · 3 of 3 positions shown · non-contrast
Comparison: None.

CLINICAL DATA: Pt states she fell and twisted foot when falling
yesterday and it Ernst-Otto Basaran "cracking" sound and has had pain and
swelling to LT ankle and LT foot since; pt states most of her pain
is in LT ankle and her 4th-4th digits of LT foot have felt numb
since; pt denies any previous hx

EXAM:
LEFT ANKLE COMPLETE - 3+ VIEW

[Series 1: dg ankle complete left · 0.14mm/px · 3 of 3 slices shown]
[im 1/3]
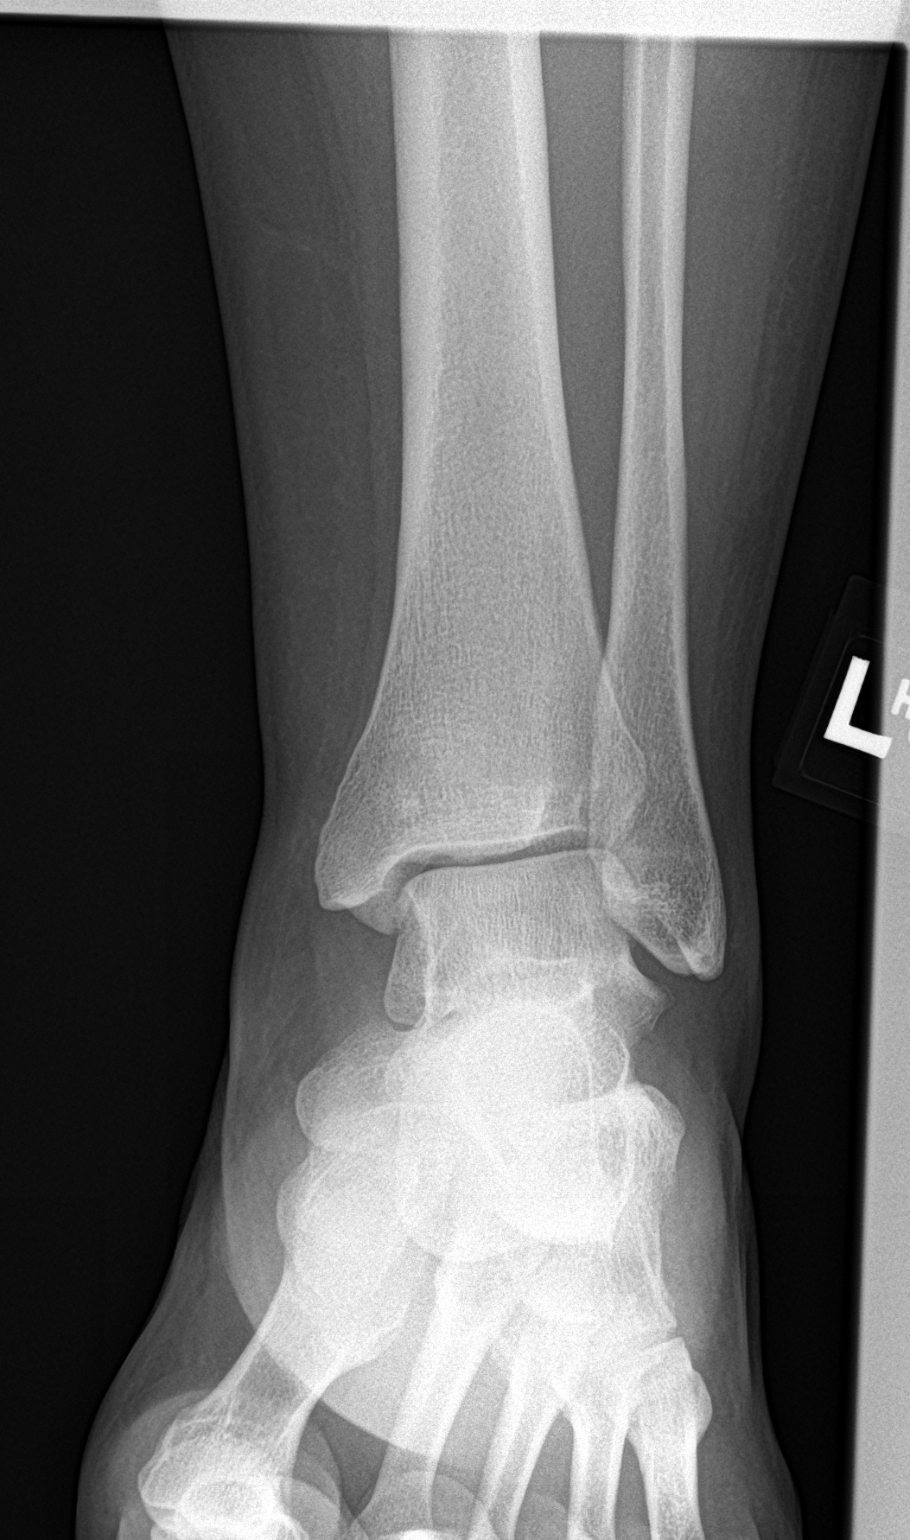
[im 2/3]
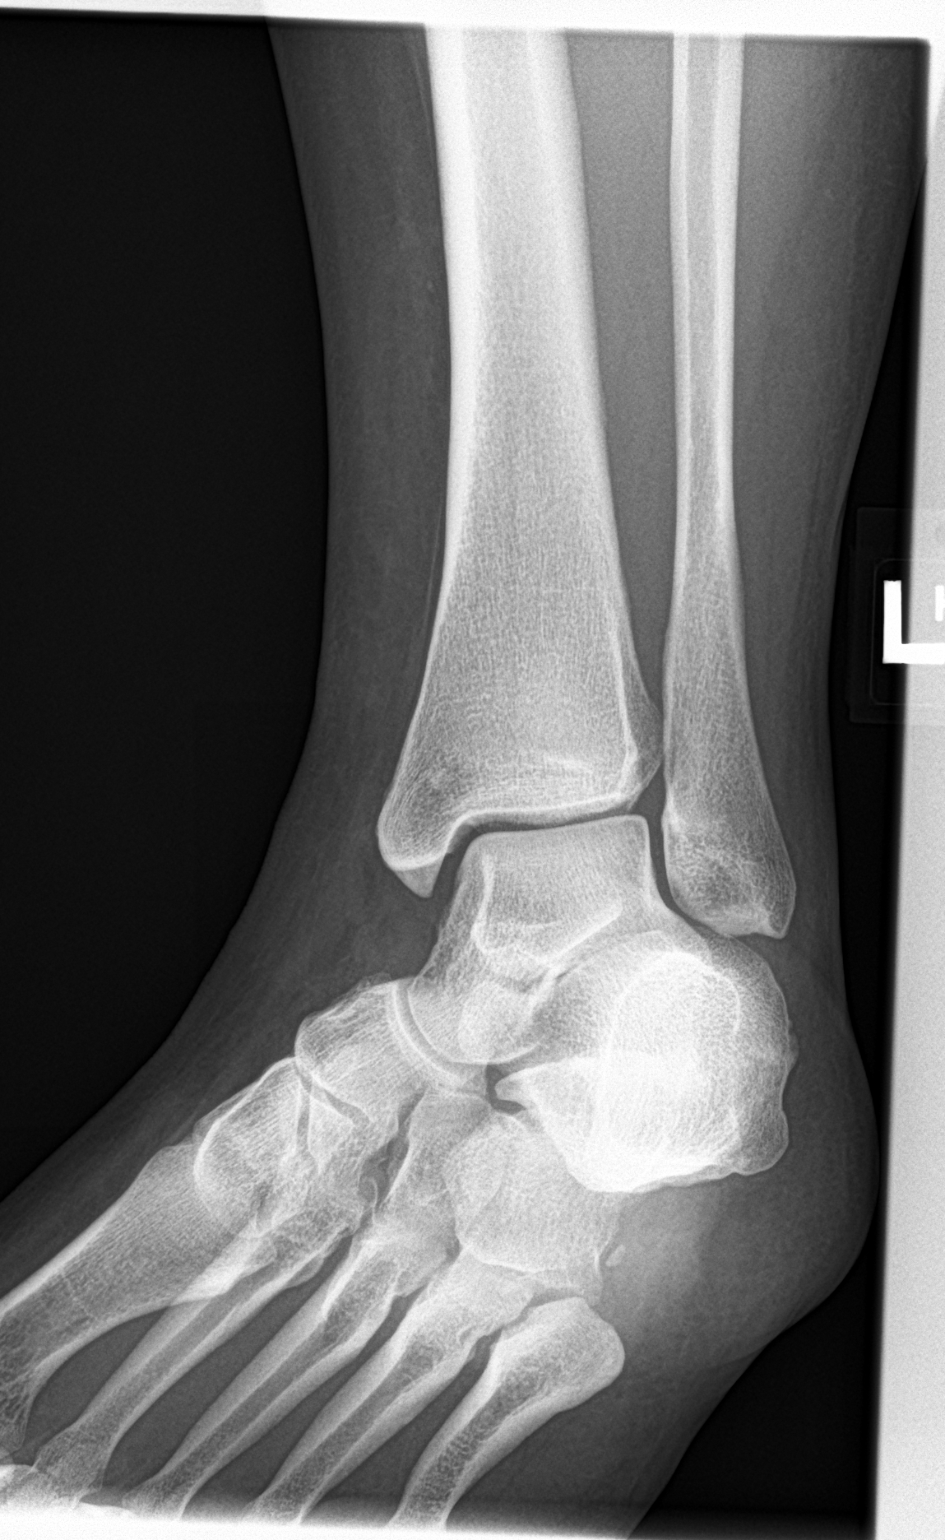
[im 3/3]
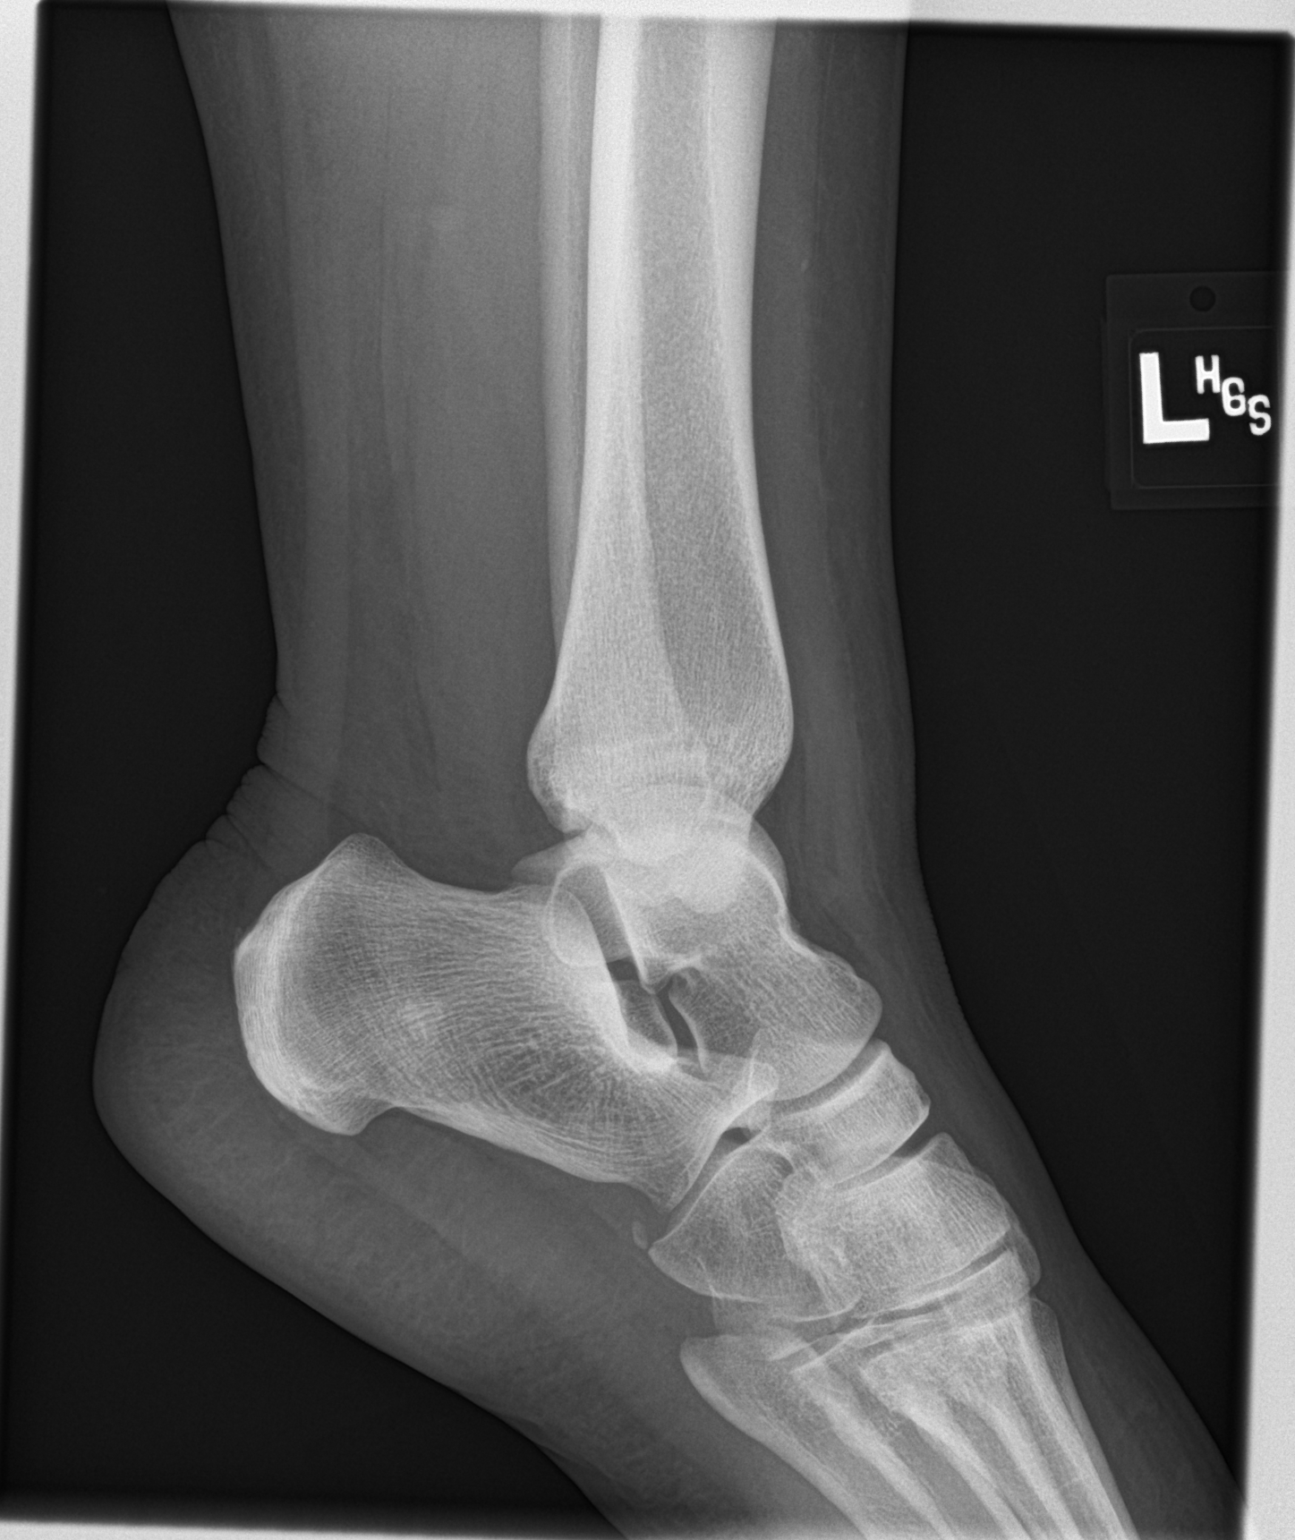

[3 of 3 positions shown; findings below may reference images not displayed]

FINDINGS: There is no evidence of fracture, dislocation, or joint effusion.
There is no evidence of arthropathy or other focal bone abnormality.
Soft tissues are unremarkable.
IMPRESSION: Negative.

## 2019-08-05 ENCOUNTER — Encounter: Payer: Self-pay | Admitting: Family Medicine

## 2019-08-05 ENCOUNTER — Ambulatory Visit (INDEPENDENT_AMBULATORY_CARE_PROVIDER_SITE_OTHER): Payer: BLUE CROSS/BLUE SHIELD | Admitting: Family Medicine

## 2019-08-05 VITALS — Ht 65.0 in | Wt 259.4 lb

## 2019-08-05 DIAGNOSIS — J0100 Acute maxillary sinusitis, unspecified: Secondary | ICD-10-CM

## 2019-08-05 MED ORDER — PREDNISONE 10 MG PO TABS: ORAL_TABLET | ORAL | 0 refills | Status: DC

## 2019-08-05 NOTE — Progress Notes (Signed)
Established patient visit   Patient: Ashlee Wilson   DOB: Mar 19, 1986   34 y.o. Female  MRN: [id number] Visit Date: 08/05/2019  I,Sulibeya S Dimas,acting as a scribe for Lavon Paganini, MD.,have documented all relevant documentation on the behalf of Lavon Paganini, MD,as directed by  Lavon Paganini, MD while in the presence of Lavon Paganini, MD.  Today's healthcare provider: Lavon Paganini, MD   Chief Complaint  Patient presents with  . URI   Subjective    HPI Upper Respiratory Infection: Patient complains of symptoms of a URI, possible sinusitis. Symptoms include bilateral ear pain and sinus pain and pressure. Onset of symptoms was 4 days ago, gradually worsening since that time. She also c/o bilateral ear pressure/pain, congestion, facial pain, purulent nasal discharge, sinus pressure and tooth pain for the past 2 days .  She is drinking plenty of fluids. Evaluation to date: none. Treatment to date: antihistamines and decongestants.  Reports this happens around allergy season typically.  Taking antihistamine and flonase  Social History   Tobacco Use  . Smoking status: Former Smoker    Quit date: 04/16/2007    Years since quitting: 12.3  . Smokeless tobacco: Never Used  Substance Use Topics  . Alcohol use: No    Alcohol/week: 0.0 standard drinks  . Drug use: No       Medications: Outpatient Medications Prior to Visit  Medication Sig  . [DISCONTINUED] doxycycline (VIBRA-TABS) 100 MG tablet Take 1 tablet (100 mg total) by mouth 2 (two) times daily.  . [DISCONTINUED] FLUoxetine (PROZAC) 10 MG tablet Take 1 tablet (10 mg total) by mouth daily. (Patient not taking: Reported on 02/15/2018)  . [DISCONTINUED] predniSONE (DELTASONE) 20 MG tablet Take 1 tablet (20 mg total) by mouth daily with breakfast.   No facility-administered medications prior to visit.    Review of Systems  Constitutional: Negative for chills, diaphoresis and fever.  HENT: Positive  for ear pain, postnasal drip, rhinorrhea, sinus pressure, sinus pain and sneezing.   Eyes: Positive for itching.  Respiratory: Negative for cough, shortness of breath and wheezing.   Cardiovascular: Negative for chest pain and palpitations.       Objective    Ht 5\' 5"  (1.651 m)   Wt 259 lb 6.4 oz (117.7 kg)   BMI 43.17 kg/m    Physical Exam Constitutional:      General: She is not in acute distress.    Appearance: Normal appearance. She is not diaphoretic.  Pulmonary:     Effort: Pulmonary effort is normal. No respiratory distress.  Neurological:     Mental Status: She is alert and oriented to person, place, and time.       No results found for any visits on 08/05/19.  Assessment & Plan    1. Acute non-recurrent maxillary sinusitis - symptoms  c/w sinusitis   - no evidence of AOM, CAP, strep pharyngitis, or other infection - concern for possible COVID19 infection - will send for outpatient testing - discussed need to quarantine 10 days from start of symptoms and until fever-free for at least 48 hours if positive - given duration of symptoms, doubt bacterial etiology at this time - will treat with prednisone taper x6d as this is likely allergy medicated - if symptoms persist 7-10 days total despite prednisone, will consider abx therapy at that time - discussed symptomatic management (flonase, decongestants, etc), natural course, and return precautions    Return if symptoms worsen or fail to improve.  I, Shirlee Latch, MD, have reviewed all documentation for this visit. The documentation on 08/05/19 for the exam, diagnosis, procedures, and orders are all accurate and complete.   Jacquelyn Shadrick, Marzella Schlein, MD, MPH [facility]

## 2020-03-02 LAB — OB RESULTS CONSOLE GC/CHLAMYDIA
Chlamydia: NEGATIVE
Gonorrhea: NEGATIVE

## 2020-03-02 LAB — OB RESULTS CONSOLE HEPATITIS B SURFACE ANTIGEN: Hepatitis B Surface Ag: NEGATIVE

## 2020-03-02 LAB — OB RESULTS CONSOLE VARICELLA ZOSTER ANTIBODY, IGG: Varicella: IMMUNE

## 2020-03-02 LAB — OB RESULTS CONSOLE RPR: RPR: NONREACTIVE

## 2020-03-02 LAB — OB RESULTS CONSOLE HIV ANTIBODY (ROUTINE TESTING): HIV: NONREACTIVE

## 2020-03-02 LAB — OB RESULTS CONSOLE RUBELLA ANTIBODY, IGM: Rubella: IMMUNE

## 2020-03-19 ENCOUNTER — Encounter: Payer: Self-pay | Admitting: Oncology

## 2020-03-19 ENCOUNTER — Other Ambulatory Visit: Payer: Self-pay

## 2020-03-19 ENCOUNTER — Inpatient Hospital Stay: Payer: BLUE CROSS/BLUE SHIELD | Attending: Oncology | Admitting: Oncology

## 2020-03-19 ENCOUNTER — Inpatient Hospital Stay: Payer: BLUE CROSS/BLUE SHIELD

## 2020-03-19 VITALS — BP 126/82 | HR 103 | Temp 98.4°F | Resp 103 | Wt 243.7 lb

## 2020-03-19 DIAGNOSIS — Z3492 Encounter for supervision of normal pregnancy, unspecified, second trimester: Secondary | ICD-10-CM | POA: Diagnosis not present

## 2020-03-19 DIAGNOSIS — Z808 Family history of malignant neoplasm of other organs or systems: Secondary | ICD-10-CM | POA: Diagnosis not present

## 2020-03-19 DIAGNOSIS — D696 Thrombocytopenia, unspecified: Secondary | ICD-10-CM | POA: Insufficient documentation

## 2020-03-19 DIAGNOSIS — Z87891 Personal history of nicotine dependence: Secondary | ICD-10-CM | POA: Diagnosis not present

## 2020-03-19 DIAGNOSIS — Z8042 Family history of malignant neoplasm of prostate: Secondary | ICD-10-CM

## 2020-03-19 LAB — CBC WITH DIFFERENTIAL/PLATELET
Abs Immature Granulocytes: 0.05 10*3/uL (ref 0.00–0.07)
Basophils Absolute: 0 10*3/uL (ref 0.0–0.1)
Basophils Relative: 0 %
Eosinophils Absolute: 0 10*3/uL (ref 0.0–0.5)
Eosinophils Relative: 0 %
HCT: 38.1 % (ref 36.0–46.0)
Hemoglobin: 12.6 g/dL (ref 12.0–15.0)
Immature Granulocytes: 1 %
Lymphocytes Relative: 17 %
Lymphs Abs: 1.6 10*3/uL (ref 0.7–4.0)
MCH: 26.4 pg (ref 26.0–34.0)
MCHC: 33.1 g/dL (ref 30.0–36.0)
MCV: 79.7 fL — ABNORMAL LOW (ref 80.0–100.0)
Monocytes Absolute: 0.6 10*3/uL (ref 0.1–1.0)
Monocytes Relative: 6 %
Neutro Abs: 7.2 10*3/uL (ref 1.7–7.7)
Neutrophils Relative %: 76 %
Platelets: 212 10*3/uL (ref 150–400)
RBC: 4.78 MIL/uL (ref 3.87–5.11)
RDW: 15 % (ref 11.5–15.5)
WBC: 9.5 10*3/uL (ref 4.0–10.5)
nRBC: 0 % (ref 0.0–0.2)

## 2020-03-19 LAB — PATHOLOGIST SMEAR REVIEW

## 2020-03-19 LAB — VITAMIN B12: Vitamin B-12: 289 pg/mL (ref 180–914)

## 2020-03-19 LAB — HEPATITIS C ANTIBODY: HCV Ab: NONREACTIVE

## 2020-03-19 LAB — IMMATURE PLATELET FRACTION: Immature Platelet Fraction: 3.1 % (ref 1.2–8.6)

## 2020-03-19 LAB — LACTATE DEHYDROGENASE: LDH: 103 U/L (ref 98–192)

## 2020-03-19 LAB — FOLATE: Folate: 21 ng/mL (ref >5.9)

## 2020-03-19 NOTE — Progress Notes (Signed)
New patient evaluation.   

## 2020-03-19 NOTE — Progress Notes (Signed)
Hematology/Oncology Consult note [facility] [phone] [phone]   Patient Care Team: Anola Gurney, [place] as PCP - General (Family Medicine) Maryella Shivers as Physician Assistant (Physician Assistant) Kieth Brightly, MD (General Surgery)  REFERRING PROVIDER: Haroldine Laws, CNM  CHIEF COMPLAINTS/REASON FOR VISIT:  Evaluation of thrombocytopenia  HISTORY OF PRESENTING ILLNESS:  Ashlee Wilson is a 34 y.o. female who was seen in consultation at the request of Haroldine Laws, CNM for evaluation of thrombocytopenia   Reviewed patient's labs done previously.  11/22/21Labs showed decreased platelet counts at 97,000. Normal wbc  hemoglobin  Reviewed patient's previous labs.  She showed me previous lab results from her MyChart app on her cell phone.  She had a normal platelet counts during her first pregnancy, platelet count 187,000.  Further earlier than that, reticulocyte count is in the 300,000. Patient is currently pregnant in her second trimester.  This is her second pregnancy.  Associated symptoms or signs:  Denies weight loss, fever, chills, fatigue, night sweats.  Denies hematochezia, hematuria, hematemesis, epistaxis, black tarry stool.  easy bruising.   Context:  History of hepatitis or HIV infection, denies She denies any dietary restriction.  Denies previous gastroenterology surgery She gains weight appropriately for pregnancy Occasional alcohol use previously.  Denies any easy bleeding or bruising.  Review of Systems  Constitutional: Negative for appetite change, chills, fatigue and fever.  HENT:   Negative for hearing loss and voice change.   Eyes: Negative for eye problems.  Respiratory: Negative for chest tightness and cough.   Cardiovascular: Negative for chest pain.  Gastrointestinal: Negative for abdominal distention, abdominal pain and blood in stool.  Endocrine: Negative for hot flashes.   Genitourinary: Negative for difficulty urinating and frequency.   Musculoskeletal: Negative for arthralgias.  Skin: Negative for itching and rash.  Neurological: Negative for extremity weakness.  Hematological: Negative for adenopathy.  Psychiatric/Behavioral: Negative for confusion.    MEDICAL HISTORY:  Past Medical History:  Diagnosis Date  . Anxiety 2012   non longer on meds    SURGICAL HISTORY: Past Surgical History:  Procedure Laterality Date  . CESAREAN SECTION N/A 01/04/2016   Procedure: CESAREAN SECTION;  Surgeon: Conard Novak, MD;  Location: [facility] ORS;  Service: Obstetrics;  Laterality: N/A;  . GALLBLADDER SURGERY  2014  . LAPAROSCOPIC CHOLECYSTECTOMY N/A 2013  . MOUTH SURGERY N/A 2003-07   lower teeth removed, manually pulled adult teeth up over several years    SOCIAL HISTORY: Social History   Socioeconomic History  . Marital status: Married    Spouse name: Not on file  . Number of children: Not on file  . Years of education: Not on file  . Highest education level: Not on file  Occupational History  . Not on file  Tobacco Use  . Smoking status: Former Smoker    Quit date: 04/16/2007    Years since quitting: 12.9  . Smokeless tobacco: Never Used  Vaping Use  . Vaping Use: Never used  Substance and Sexual Activity  . Alcohol use: No    Alcohol/week: 0.0 standard drinks  . Drug use: No  . Sexual activity: Yes    Birth control/protection: None  Other Topics Concern  . Not on file  Social History Narrative  . Not on file   Social Determinants of Health   Financial Resource Strain: Not on file  Food Insecurity: Not on file  Transportation Needs: Not on file  Physical Activity: Not on file  Stress:  Not on file  Social Connections: Not on file  Intimate Partner Violence: Not on file    FAMILY HISTORY: Family History  Problem Relation Age of Onset  . Asthma Mother   . Hyperlipidemia Father   . Prostate cancer Father   . Skin cancer Father      ALLERGIES:  is allergic to benadryl [diphenhydramine], nickel, oxycodone-acetaminophen, paroxetine, and penicillins.  MEDICATIONS:  Current Outpatient Medications  Medication Sig Dispense Refill  . Prenatal 28-0.8 MG TABS Take 1 tablet by mouth daily.    . predniSONE (DELTASONE) 10 MG tablet Take 60mg  PO daily x1d, then 50mg  daily x1d, then 40mg  daily x1d, then 30mg  daily x1d, then 20mg  daily x1d, then 10mg  daily x1d, then stop (Patient not taking: Reported on 03/19/2020) 21 tablet 0   No current facility-administered medications for this visit.     PHYSICAL EXAMINATION: ECOG PERFORMANCE STATUS: 0 - Asymptomatic Vitals:   03/19/20 1001  BP: 126/82  Pulse: (!) 103  Resp: (!) 103  Temp: 98.4 F (36.9 C)   Filed Weights   03/19/20 1001  Weight: 243 lb 11.2 oz (110.5 kg)    Physical Exam Constitutional:      General: She is not in acute distress. HENT:     Head: Normocephalic and atraumatic.  Eyes:     General: No scleral icterus. Cardiovascular:     Rate and Rhythm: Normal rate and regular rhythm.     Heart sounds: Normal heart sounds.  Pulmonary:     Effort: Pulmonary effort is normal. No respiratory distress.     Breath sounds: No wheezing.  Abdominal:     General: Bowel sounds are normal. There is no distension.     Palpations: Abdomen is soft.  Musculoskeletal:        General: No deformity. Normal range of motion.     Cervical back: Normal range of motion and neck supple.  Skin:    General: Skin is warm and dry.     Findings: No erythema or rash.  Neurological:     Mental Status: She is alert and oriented to person, place, and time. Mental status is at baseline.     Cranial Nerves: No cranial nerve deficit.     Coordination: Coordination normal.  Psychiatric:        Mood and Affect: Mood normal.      LABORATORY DATA:  I have reviewed the data as listed Lab Results  Component Value Date   WBC 9.5 03/19/2020   HGB 12.6 03/19/2020   HCT 38.1  03/19/2020   MCV 79.7 (L) 03/19/2020   PLT 212 03/19/2020   No results for input(s): NA, K, CL, CO2, GLUCOSE, BUN, CREATININE, CALCIUM, GFRNONAA, GFRAA, PROT, ALBUMIN, AST, ALT, ALKPHOS, BILITOT, BILIDIR, IBILI in the last 8760 hours. Iron/TIBC/Ferritin/ %Sat No results found for: IRON, TIBC, FERRITIN, IRONPCTSAT    RADIOGRAPHIC STUDIES: I have personally reviewed the radiological images as listed and agreed with the findings in the report.  No results found.   ASSESSMENT & PLAN:  1. Thrombocytopenia (HCC)   2. Second trimester pregnancy     For the work up of patient's thrombocytopenia, I recommend checking CBC; LDH; smear review, folate, Vitamin B12, hepatitis c  ANA flowcytometry etc. Hep B antigen negative, HIV negative. # Patient follow-up with me in approximately 2 weeks to review the above results.   Orders Placed This Encounter  Procedures  . Vitamin B12    Standing Status:   Future    Number  of Occurrences:   1    Standing Expiration Date:   03/19/2021  . Folate    Standing Status:   Future    Number of Occurrences:   1    Standing Expiration Date:   03/19/2021  . CBC with Differential/Platelet    Standing Status:   Future    Number of Occurrences:   1    Standing Expiration Date:   03/19/2021  . Flow cytometry panel-leukemia/lymphoma work-up    Standing Status:   Future    Number of Occurrences:   1    Standing Expiration Date:   03/19/2021  . Lactate dehydrogenase    Standing Status:   Future    Number of Occurrences:   1    Standing Expiration Date:   03/19/2021  . Immature Platelet Fraction    Standing Status:   Future    Number of Occurrences:   1    Standing Expiration Date:   03/19/2021  . ANA, IFA (with reflex)    Standing Status:   Future    Number of Occurrences:   1    Standing Expiration Date:   03/19/2021  . Hepatitis C antibody    Standing Status:   Future    Number of Occurrences:   1    Standing Expiration Date:   03/19/2021  . Pathologist  smear review    Standing Status:   Future    Number of Occurrences:   1    Standing Expiration Date:   03/19/2021    All questions were answered. The patient knows to call the clinic with any problems questions or concerns.  Cc Haroldine Laws, CNM  Return of visit: 2 weeks.  Thank you for this kind referral and the opportunity to participate in the care of this patient. A copy of today's note is routed to referring provider    Rickard Patience, MD, PhD 03/19/2020

## 2020-03-20 LAB — ANTINUCLEAR ANTIBODIES, IFA: ANA Ab, IFA: NEGATIVE

## 2020-03-23 LAB — COMP PANEL: LEUKEMIA/LYMPHOMA

## 2020-04-06 ENCOUNTER — Encounter: Payer: Self-pay | Admitting: Oncology

## 2020-04-06 ENCOUNTER — Inpatient Hospital Stay (HOSPITAL_BASED_OUTPATIENT_CLINIC_OR_DEPARTMENT_OTHER): Payer: BLUE CROSS/BLUE SHIELD | Admitting: Oncology

## 2020-04-06 DIAGNOSIS — D696 Thrombocytopenia, unspecified: Secondary | ICD-10-CM | POA: Diagnosis not present

## 2020-04-06 NOTE — Progress Notes (Signed)
HEMATOLOGY-ONCOLOGY TeleHEALTH VISIT PROGRESS NOTE  I connected with Ashlee Wilson on @ENCDATE  @ at  3:00 PM EST by video enabled telemedicine visit and verified that I am speaking with the correct person using two identifiers. I discussed the limitations, risks, security and privacy concerns of performing an evaluation and management service by telemedicine and the availability of in-person appointments. The patient expressed understanding and agreed to proceed.   Other persons participating in the visit and their role in the encounter:  None  Patient's location: Home  Provider's location: office Chief Complaint: Follow-up for thrombocytopenia.   INTERVAL HISTORY Ashlee Wilson is a 34 y.o. female who has above history reviewed by me today presents for follow up visit for thrombocytopenia Problems and complaints are listed below:  I attempted to connect the patient for visual enabled telehealth visit.  Due to the technical difficulties with video,  Patient was transitioned to audio only visit. Patient has no new complaints.  Review of Systems  Constitutional: Negative for appetite change, chills, fatigue and fever.  HENT:   Negative for hearing loss and voice change.   Eyes: Negative for eye problems.  Respiratory: Negative for chest tightness and cough.   Cardiovascular: Negative for chest pain.  Gastrointestinal: Negative for abdominal distention, abdominal pain and blood in stool.  Endocrine: Negative for hot flashes.  Genitourinary: Negative for difficulty urinating and frequency.   Musculoskeletal: Negative for arthralgias.  Skin: Negative for itching and rash.  Neurological: Negative for extremity weakness.  Hematological: Negative for adenopathy.  Psychiatric/Behavioral: Negative for confusion.    Past Medical History:  Diagnosis Date   Anxiety 2012   non longer on meds   Past Surgical History:  Procedure Laterality Date   CESAREAN SECTION N/A 01/04/2016    Procedure: CESAREAN SECTION;  Surgeon: 01/06/2016, MD;  Location: [facility] ORS;  Service: Obstetrics;  Laterality: N/A;   GALLBLADDER SURGERY  2014   LAPAROSCOPIC CHOLECYSTECTOMY N/A 2013   MOUTH SURGERY N/A 2003-07   lower teeth removed, manually pulled adult teeth up over several years    Family History  Problem Relation Age of Onset   Asthma Mother    Hyperlipidemia Father    Prostate cancer Father    Skin cancer Father     Social History   Socioeconomic History   Marital status: Married    Spouse name: Not on file   Number of children: Not on file   Years of education: Not on file   Highest education level: Not on file  Occupational History   Not on file  Tobacco Use   Smoking status: Former Smoker    Quit date: 04/16/2007    Years since quitting: 12.9   Smokeless tobacco: Never Used  Vaping Use   Vaping Use: Never used  Substance and Sexual Activity   Alcohol use: No    Alcohol/week: 0.0 standard drinks   Drug use: No   Sexual activity: Yes    Birth control/protection: None  Other Topics Concern   Not on file  Social History Narrative   Not on file   Social Determinants of Health   Financial Resource Strain: Not on file  Food Insecurity: Not on file  Transportation Needs: Not on file  Physical Activity: Not on file  Stress: Not on file  Social Connections: Not on file  Intimate Partner Violence: Not on file    Current Outpatient Medications on File Prior to Visit  Medication Sig Dispense Refill   Prenatal 28-0.8 MG  TABS Take 1 tablet by mouth daily.     No current facility-administered medications on file prior to visit.    Allergies  Allergen Reactions   Benadryl [Diphenhydramine] Other (See Comments)    HR slowed, and BP dropped   Nickel Other (See Comments)    Skin rash   Oxycodone-Acetaminophen Other (See Comments)    Pt expressed that she received for mouth sx years ago- pain med was such- and pt became incapcitated    Paroxetine    Penicillins Other (See Comments)    Pt states occurred as a baby, does not know reaction, but avoids taking such       Observations/Objective: Today's Vitals   04/06/20 1412  PainSc: 0-No pain   There is no height or weight on file to calculate BMI.  Physical Exam  CBC    Component Value Date/Time   WBC 9.5 03/19/2020 1050   RBC 4.78 03/19/2020 1050   HGB 12.6 03/19/2020 1050   HGB 13.4 12/14/2017 1537   HCT 38.1 03/19/2020 1050   HCT 40.4 12/14/2017 1537   PLT 212 03/19/2020 1050   PLT 307 12/14/2017 1537   MCV 79.7 (L) 03/19/2020 1050   MCV 81 12/14/2017 1537   MCH 26.4 03/19/2020 1050   MCHC 33.1 03/19/2020 1050   RDW 15.0 03/19/2020 1050   RDW 13.9 12/14/2017 1537   LYMPHSABS 1.6 03/19/2020 1050   LYMPHSABS 2.1 12/14/2017 1537   MONOABS 0.6 03/19/2020 1050   EOSABS 0.0 03/19/2020 1050   EOSABS 0.0 12/14/2017 1537   BASOSABS 0.0 03/19/2020 1050   BASOSABS 0.0 12/14/2017 1537    CMP  No results found for: NA, K, CL, CO2, GLUCOSE, BUN, CREATININE, CALCIUM, PROT, ALBUMIN, AST, ALT, ALKPHOS, BILITOT, GFRNONAA, GFRAA   Assessment and Plan: 1. Thrombocytopenia (HCC)     Labs reviewed and discussed with patient. History of thrombocytopenia during pregnancy. Repeat CBC showed normalization of platelet counts. Additional work-up showed negative ANA, negative hepatitis C panel, normal LDH, negative flow cytometry, normal folate and vitamin B12 level. No need for additional work-up. Initial thrombocytopenia may be secondary to clumping platelets. Recommend patient to continue follow-up with OB/GYN. If future CBC showed thrombocytopenia, recommend to check smear to rule out pseudothrombocytopenia. I discussed the possibility of gestational thrombocytopenia. Patient can call clinic if she develops additional symptoms or concerns.  Follow Up Instructions: Discharge from clinic.   I discussed the assessment and treatment plan with the patient. The  patient was provided an opportunity to ask questions and all were answered. The patient agreed with the plan and demonstrated an understanding of the instructions.  The patient was advised to call back or seek an in-person evaluation if the symptoms worsen or if the condition fails to improve as anticipated.    Rickard Patience, MD 04/06/2020 9:38 PM

## 2020-04-06 NOTE — Progress Notes (Signed)
Patient contacted for Mychart visit. No new concerns voiced.  

## 2020-07-27 ENCOUNTER — Other Ambulatory Visit
Admission: RE | Admit: 2020-07-27 | Discharge: 2020-07-27 | Disposition: A | Discharge status: Home / Self Care | Payer: 59 | Source: Ambulatory Visit | Attending: Obstetrics and Gynecology | Admitting: Obstetrics and Gynecology

## 2020-07-27 ENCOUNTER — Other Ambulatory Visit: Payer: Self-pay

## 2020-07-27 NOTE — Consult Note (Signed)
[facility] Anesthesia Consultation  Ashlee Wilson QIW:[id number] DOB: [birth year] DOA: 07/27/2020 PCP: Gracelyn Nurse, MD   Requesting physician: Dr. Feliberto Gottron Date of consultation: 07/27/20 Reason for consultation: Obesity during pregnancy  CHIEF COMPLAINT:  Obesity during pregnancy  HISTORY OF PRESENT ILLNESS: Ashlee Wilson  is a 35 y.o. female with a known history of obesity during pregnancy. Prior csection done under neuraxial anesthesia due to maternal fever after prolonged rupture of membranes. Denies hx of cardiovascular disease. Denies hx of asthma. Denies personal or family hx of bleeding disorders.   PAST MEDICAL HISTORY:   Past Medical History:  Diagnosis Date  . Anxiety 2012   non longer on meds    PAST SURGICAL HISTORY:  Past Surgical History:  Procedure Laterality Date  . CESAREAN SECTION N/A 01/04/2016   Procedure: CESAREAN SECTION;  Surgeon: Conard Novak, MD;  Location: [facility] ORS;  Service: Obstetrics;  Laterality: N/A;  . GALLBLADDER SURGERY  2014  . LAPAROSCOPIC CHOLECYSTECTOMY N/A 2013  . MOUTH SURGERY N/A 2003-07   lower teeth removed, manually pulled adult teeth up over several years    SOCIAL HISTORY:  Social History   Tobacco Use  . Smoking status: Former Smoker    Quit date: 04/16/2007    Years since quitting: 13.2  . Smokeless tobacco: Never Used  Substance Use Topics  . Alcohol use: No    Alcohol/week: 0.0 standard drinks    FAMILY HISTORY:  Family History  Problem Relation Age of Onset  . Asthma Mother   . Hyperlipidemia Father   . Prostate cancer Father   . Skin cancer Father     DRUG ALLERGIES:  Allergies  Allergen Reactions  . Benadryl [Diphenhydramine] Other (See Comments)    HR slowed, and BP dropped  . Nickel Other (See Comments)    Skin rash  . Oxycodone-Acetaminophen Other (See Comments)    Pt expressed that she received for mouth sx years ago- pain med was such- and pt became  incapcitated  . Paroxetine   . Penicillins Other (See Comments)    Pt states occurred as a baby, does not know reaction, but avoids taking such    REVIEW OF SYSTEMS:   RESPIRATORY: No cough, shortness of breath, wheezing.  CARDIOVASCULAR: No chest pain, orthopnea, edema.  HEMATOLOGY: No anemia, easy bruising or bleeding SKIN: No rash or lesion. NEUROLOGIC: No tingling, numbness, weakness.  PSYCHIATRY: No anxiety or depression.   MEDICATIONS AT HOME:  Prior to Admission medications   Medication Sig Start Date End Date Taking? Authorizing Provider  Prenatal 28-0.8 MG TABS Take 1 tablet by mouth daily.    [provider]      PHYSICAL EXAMINATION:   VITAL SIGNS: unknown if currently breastfeeding.  GENERAL:  35 y.o.-year-old patient no acute distress.  HEENT: Head atraumatic, normocephalic. Oropharynx and nasopharynx clear. MP 2, TM distance >3 cm, normal mouth opening, grade 2 upper lip bite LUNGS: No use of accessory muscles of respiration.   EXTREMITIES: No pedal edema, cyanosis, or clubbing.  NEUROLOGIC: normal gait PSYCHIATRIC: The patient is alert and oriented x 3.  SKIN: No obvious rash, lesion, or ulcer.    IMPRESSION AND PLAN:   Ashlee Wilson  is a 35 y.o. female presenting with obesity during pregnancy. BMI is currently 46 at [redacted] weeks gestation.   Airway exam reassuring. Spinal interspaces palpable.   Discussed spinal for scheduled repeat cesarean delivery. Discussed spinal vs GA if unscheduled/emergency cesarean delivery is required. Discussed increased risk of  difficult intubation during pregnancy should an emergency cesarean delivery be required.   Plan for delivery at [facility].

## 2020-09-02 LAB — OB RESULTS CONSOLE RPR: RPR: NONREACTIVE

## 2020-09-02 LAB — OB RESULTS CONSOLE GBS: GBS: NEGATIVE

## 2020-09-02 LAB — OB RESULTS CONSOLE GC/CHLAMYDIA
Chlamydia: NEGATIVE
Gonorrhea: NEGATIVE

## 2020-09-02 LAB — OB RESULTS CONSOLE HIV ANTIBODY (ROUTINE TESTING): HIV: NONREACTIVE

## 2020-09-02 NOTE — H&P (Signed)
Ashlee Wilson is a 35 y.o. female presenting forelective repeat LTCS+ BTL edc 6/15 based on unsure lmp and [redacted]W[redacted]D u/s. EGA on surgery day 39+0 OB History    Gravida  2   Para  1   Term  1   Preterm      AB      Living        SAB      IAB      Ectopic      Multiple  0   Live Births             Past Medical History:  Diagnosis Date  . Anxiety 2012   non longer on meds   Past Surgical History:  Procedure Laterality Date  . CESAREAN SECTION N/A 01/04/2016   Procedure: CESAREAN SECTION;  Surgeon: Conard Novak, MD;  Location: [facility] ORS;  Service: Obstetrics;  Laterality: N/A;  . GALLBLADDER SURGERY  2014  . LAPAROSCOPIC CHOLECYSTECTOMY N/A 2013  . MOUTH SURGERY N/A 2003-07   lower teeth removed, manually pulled adult teeth up over several years   Family History: family history includes Asthma in her mother; Hyperlipidemia in her father; Prostate cancer in her father; Skin cancer in her father. Social History:  reports that she quit smoking about 13 years ago. She has never used smokeless tobacco. She reports that she does not drink alcohol and does not use drugs.     Maternal Diabetes: No Genetic Screening: Normal Maternal Ultrasounds/Referrals: Normal Fetal Ultrasounds or other Referrals:  None Maternal Substance Abuse:  No Significant Maternal Medications:  None Significant Maternal Lab Results:  None Other Comments:  None  Review of Systems Review of Systems: A full review of systems was performed and negative except as noted in the HPI.   Eyes: no vision change  Ears: left ear pain  Oropharynx: no sore throat  Pulmonary . No shortness of breath , no hemoptysis Cardiovascular: no chest pain , no irregular heart beat  Gastrointestinal:no blood in stool . No diarrhea, no constipation Uro gynecologic: no dysuria , no pelvic pain Neurologic : no seizure , no migraines    Musculoskeletal: no muscular weakness  History   unknown if currently  breastfeeding. Exam Physical Exam  Lungs   cv RRR  Abd: gravid  Prenatal labs: ABO, Rh:  o+ Antibody:  neg Rubella:  Imm/ vaicella : imm RPR:   nr HBsAg:   neg HIV:   neg GBS:   pending  Assessment/Plan: Elective repeat LTCS+BTL on 09/16/20 = 39+[redacted] weeks EGA  Pt has been counseled regarding the risk of the procedure and failure rate from the BTL    [employer] 09/02/2020, 8:46 AM

## 2020-09-09 ENCOUNTER — Inpatient Hospital Stay: Admission: RE | Admit: 2020-09-09 | Payer: BLUE CROSS/BLUE SHIELD | Source: Ambulatory Visit

## 2020-09-09 ENCOUNTER — Other Ambulatory Visit: Payer: Self-pay

## 2020-09-09 ENCOUNTER — Other Ambulatory Visit
Admission: RE | Admit: 2020-09-09 | Discharge: 2020-09-09 | Disposition: A | Discharge status: Home / Self Care | Payer: 59 | Source: Ambulatory Visit | Attending: Obstetrics and Gynecology | Admitting: Obstetrics and Gynecology

## 2020-09-09 NOTE — Patient Instructions (Signed)
Your procedure is scheduled on: Wednesday September 16, 2020. Report to Labor and Delivery, come through the Emergency Department if have to be here before 6am. To find out your arrival time please call [phone] between 1PM - 3PM on Monday September 14, 2020.  Remember: Instructions that are not followed completely may result in serious medical risk,  up to and including death, or upon the discretion of your surgeon and anesthesiologist your  surgery may need to be rescheduled.     _X__ 1. Do not eat food after midnight the night before your procedure.                 No chewing gum or hard candies. You may drink clear liquids up to 2 hours                 before you are scheduled to arrive for your surgery- DO not drink clear                 liquids within 2 hours of the start of your surgery.                 Clear Liquids include:  water, apple juice without pulp, clear Gatorade, G2 or                  Gatorade Zero (avoid Red/Purple/Blue), Black Coffee or Tea (Do not add                 anything to coffee or tea).  __X__2.  On the morning of surgery brush your teeth with toothpaste and water, you                may rinse your mouth with mouthwash if you wish.  Do not swallow any toothpaste of mouthwash.     _X__ 3.  No Alcohol for 24 hours before or after surgery.   _X__ 4.  Do Not Smoke or use e-cigarettes For 24 Hours Prior to Your Surgery.                 Do not use any chewable tobacco products for at least 6 hours prior to                 Surgery.  _X__  5.  Do not use any recreational drugs (marijuana, cocaine, heroin, ecstasy, MDMA or other)                For at least one week prior to your surgery.  Combination of these drugs with anesthesia                May have life threatening results.   __X__ 6.  Notify your doctor if there is any change in your medical condition      (cold, fever, infections).     Do not wear jewelry, make-up, hairpins, clips or  nail polish. Do not wear lotions, powders, or perfumes. You may wear deodorant. Do not shave 48 hours prior to surgery.  Do not bring valuables to the hospital.    [facility] is not responsible for any belongings or valuables.  Contacts, dentures or bridgework may not be worn into surgery. Leave your suitcase in the car. After surgery it may be brought to your room. For patients admitted to the hospital, discharge time is determined by your treatment team.   Patients discharged the day of surgery will not be allowed to drive home.   Make  arrangements for someone to be with you for the first 24 hours of your Same Day Discharge.   __X__ Take these medicines the morning of surgery with A SIP OF WATER:    1. None   2.   3.   4.  5.  6.  ____ Fleet Enema (as directed)   __X__ Use CHG Soap (or wipes) as directed  ____ Use Benzoyl Peroxide Gel as instructed  ____ Use inhalers on the day of surgery  ____ Stop metformin 2 days prior to surgery    ____ Take 1/2 of usual insulin dose the night before surgery. No insulin the morning          of surgery.   ____ Call your PCP, cardiologist, or Pulmonologist if taking Coumadin/Plavix/aspirin and ask when to stop before your surgery.   __X__ One Week prior to surgery- Stop Anti-inflammatories such as Ibuprofen, Aleve, Advil, Motrin, meloxicam (MOBIC), diclofenac, etodolac, ketorolac, Toradol, Daypro, piroxicam, Goody's or BC powders. OK TO USE TYLENOL IF NEEDED   __X__ Stop supplements until after surgery.    ____ Bring C-Pap to the hospital.    If you have any questions regarding your pre-procedure instructions,  Please call Pre-admit Testing at [phone].

## 2020-09-11 ENCOUNTER — Observation Stay
Admission: EM | Admit: 2020-09-11 | Discharge: 2020-09-11 | Disposition: A | Discharge status: Home / Self Care | Payer: 59 | Attending: Obstetrics and Gynecology | Admitting: Obstetrics and Gynecology

## 2020-09-11 ENCOUNTER — Other Ambulatory Visit: Payer: Self-pay

## 2020-09-11 DIAGNOSIS — Z3A38 38 weeks gestation of pregnancy: Secondary | ICD-10-CM | POA: Insufficient documentation

## 2020-09-11 DIAGNOSIS — O2243 Hemorrhoids in pregnancy, third trimester: Principal | ICD-10-CM | POA: Insufficient documentation

## 2020-09-11 DIAGNOSIS — K6289 Other specified diseases of anus and rectum: Secondary | ICD-10-CM | POA: Diagnosis present

## 2020-09-11 MED ORDER — HYDROCODONE-ACETAMINOPHEN 5-325 MG PO TABS: 1.0000 | ORAL_TABLET | Freq: Four times a day (QID) | ORAL | 0 refills | Status: DC | PRN

## 2020-09-11 MED ORDER — LIDOCAINE HCL URETHRAL/MUCOSAL 2 % EX GEL
1.0000 "application " | Freq: Once | CUTANEOUS | Status: AC
  Administered 2020-09-11: 1 via TOPICAL
  Filled 2020-09-11: qty 5

## 2020-09-11 MED ORDER — HYDROCODONE-ACETAMINOPHEN 5-325 MG PO TABS
1.0000 | ORAL_TABLET | Freq: Four times a day (QID) | ORAL | Status: DC | PRN
Start: 2020-09-11 — End: 2020-09-11
  Administered 2020-09-11: 1 via ORAL
  Filled 2020-09-11: qty 1

## 2020-09-11 NOTE — Discharge Instructions (Signed)

## 2020-09-11 NOTE — Discharge Summary (Signed)
Patient ID: Ashlee [place] MRN: [id number] DOB/AGE: [birth year] 34 y.o.  Admit date: 09/11/2020 Discharge date: 09/16/2020  Admission Diagnoses: 35yo G2P1 at [redacted]w[redacted]d with severe rectal/hemorrhoidal pain after procedure in the office.  Discharge Diagnoses: Pain resolved  Prenatal Procedures: NST  Consults: None  Significant Diagnostic Studies: None  Treatments: analgesia: Norco, therapies: Ice packs and Lidocaine jelly  Hospital Course:  This is a 35 y.o. G2P1000 with IUP at [redacted]w[redacted]d seen for severe rectal/hemorrhoidal pain.  SVE on arrival c/thick/high.  Norco and lidocaine jelly ordered Ice pack applied  Pain resolved  She was observed, fetal heart rate monitoring remained reassuring, and she had no signs/symptoms of labor or other maternal-fetal concerns.  She was deemed stable for discharge to home with outpatient follow up.  Discharge Physical Exam:  BP (!) 145/65 (BP Location: Left Arm)   Pulse (!) 105   Temp 98.5 F (36.9 C) (Oral)   Resp 20   LMP 12/18/2019   General: NAD CV: RRR Pulm: CTABL, nl effort ABD: s/nd/nt, gravid DVT Evaluation: LE non-ttp, no evidence of DVT on exam.  NST: FHR baseline: 135 bpm Variability: moderate Accelerations: yes Decelerations: none Category/reactivity: reactive  TOCO: quiet SVE:  Dilation: Closed Effacement (%): Thick Station: Ballotable Exam by:: Ashlee [place], CNM   Discharge Condition: Stable  Disposition:    Allergies as of 09/11/2020      Reactions   Benadryl [diphenhydramine] Other (See Comments)   HR slowed, and BP dropped   Oxycodone-acetaminophen Other (See Comments)   Pt expressed that she received for mouth sx years ago- pain med was such- and pt became incapcitated   Paroxetine    Jaw clenching, muscle spasms, couldn't sleep   Penicillins Other (See Comments)   Pt states occurred as a baby, does not know reaction, but avoids taking such   Nickel Rash      Medication List    TAKE these medications    acetaminophen 325 MG tablet Commonly known as: TYLENOL Take 650 mg by mouth every 6 (six) hours as needed for moderate pain.   HYDROcodone-acetaminophen 5-325 MG tablet Commonly known as: NORCO/VICODIN Take 1 tablet by mouth every 6 (six) hours as needed for moderate pain or severe pain.   Prenatal 28-0.8 MG Tabs Take 1 tablet by mouth daily.       Follow-up Information    Schermerhorn, Ihor Austin, MD. Schedule an appointment as soon as possible for a visit in 3 day(s).   Specialty: Obstetrics and Gynecology Contact information: [address] [place] [address] [phone]               Signed:  Quillian Quince 09/16/2020 12:07 PM

## 2020-09-11 NOTE — Progress Notes (Signed)
RN continues to attempt to keep pt. on continuous fetal monitoring per CNM request, but pt. will not remain in a position where monitoring can be accomplished. She continues to change positions continuously and writhe in the bed, making monitoring the FHR near impossible in combination with her BMI. RN will continue to attempt [place] adjustment when able.

## 2020-09-14 ENCOUNTER — Other Ambulatory Visit: Payer: Self-pay

## 2020-09-14 ENCOUNTER — Other Ambulatory Visit: Payer: 59

## 2020-09-14 ENCOUNTER — Other Ambulatory Visit
Admission: RE | Admit: 2020-09-14 | Discharge: 2020-09-14 | Disposition: A | Discharge status: Home / Self Care | Payer: 59 | Source: Ambulatory Visit | Attending: Obstetrics and Gynecology | Admitting: Obstetrics and Gynecology

## 2020-09-14 DIAGNOSIS — Z20822 Contact with and (suspected) exposure to covid-19: Secondary | ICD-10-CM | POA: Insufficient documentation

## 2020-09-14 DIAGNOSIS — Z01812 Encounter for preprocedural laboratory examination: Secondary | ICD-10-CM | POA: Insufficient documentation

## 2020-09-14 LAB — CBC
HCT: 32.9 % — ABNORMAL LOW (ref 36.0–46.0)
Hemoglobin: 10.8 g/dL — ABNORMAL LOW (ref 12.0–15.0)
MCH: 26.4 pg (ref 26.0–34.0)
MCHC: 32.8 g/dL (ref 30.0–36.0)
MCV: 80.4 fL (ref 80.0–100.0)
Platelets: 158 10*3/uL (ref 150–400)
RBC: 4.09 MIL/uL (ref 3.87–5.11)
RDW: 16.1 % — ABNORMAL HIGH (ref 11.5–15.5)
WBC: 9.8 10*3/uL (ref 4.0–10.5)
nRBC: 0 % (ref 0.0–0.2)

## 2020-09-14 LAB — BASIC METABOLIC PANEL
Anion gap: 9 (ref 5–15)
BUN: 8 mg/dL (ref 6–20)
CO2: 22 mmol/L (ref 22–32)
Calcium: 8.4 mg/dL — ABNORMAL LOW (ref 8.9–10.3)
Chloride: 107 mmol/L (ref 98–111)
Creatinine, Ser: 0.41 mg/dL — ABNORMAL LOW (ref 0.44–1.00)
GFR, Estimated: >60 mL/min (ref >60)
Glucose, Bld: 87 mg/dL (ref 70–99)
Potassium: 3.5 mmol/L (ref 3.5–5.1)
Sodium: 138 mmol/L (ref 135–145)

## 2020-09-14 LAB — TYPE AND SCREEN
ABO/RH(D): O POS
Antibody Screen: NEGATIVE
Extend sample reason: UNDETERMINED

## 2020-09-15 LAB — SARS CORONAVIRUS 2 (TAT 6-24 HRS): SARS Coronavirus 2: NEGATIVE

## 2020-09-15 MED ORDER — SOD CITRATE-CITRIC ACID 500-334 MG/5ML PO SOLN
30.0000 mL | ORAL | Status: AC
  Administered 2020-09-16: 30 mL via ORAL
  Filled 2020-09-15: qty 15

## 2020-09-15 MED ORDER — CEFAZOLIN IN SODIUM CHLORIDE 3-0.9 GM/100ML-% IV SOLN
3.0000 g | Freq: Once | INTRAVENOUS | Status: DC
  Filled 2020-09-15: qty 100

## 2020-09-15 MED ORDER — GABAPENTIN 600 MG PO TABS
300.0000 mg | ORAL_TABLET | Freq: Once | ORAL | Status: AC
  Administered 2020-09-16: 300 mg via ORAL
  Filled 2020-09-15: qty 0.5

## 2020-09-15 MED ORDER — CHLORHEXIDINE GLUCONATE 0.12 % MT SOLN
15.0000 mL | Freq: Once | OROMUCOSAL | Status: AC
  Administered 2020-09-16: 15 mL via OROMUCOSAL
  Filled 2020-09-15: qty 15

## 2020-09-15 MED ORDER — CEFAZOLIN IN SODIUM CHLORIDE 3-0.9 GM/100ML-% IV SOLN
3.0000 g | INTRAVENOUS | Status: AC
  Administered 2020-09-16: 3 g via INTRAVENOUS
  Filled 2020-09-15 (×2): qty 100

## 2020-09-15 MED ORDER — LACTATED RINGERS IV SOLN: Freq: Once | INTRAVENOUS | Status: AC

## 2020-09-15 MED ORDER — FAMOTIDINE 20 MG PO TABS
20.0000 mg | ORAL_TABLET | Freq: Once | ORAL | Status: AC
  Administered 2020-09-16: 20 mg via ORAL
  Filled 2020-09-15: qty 1

## 2020-09-15 MED ORDER — ORAL CARE MOUTH RINSE: 15.0000 mL | Freq: Once | OROMUCOSAL | Status: AC

## 2020-09-15 MED ORDER — CEFAZOLIN SODIUM-DEXTROSE 2-4 GM/100ML-% IV SOLN: 2.0000 g | INTRAVENOUS | Status: DC

## 2020-09-16 ENCOUNTER — Inpatient Hospital Stay: Payer: 59 | Admitting: Registered Nurse

## 2020-09-16 ENCOUNTER — Inpatient Hospital Stay
Admission: RE | Admit: 2020-09-16 | Discharge: 2020-09-18 | DRG: 784 | Disposition: A | Discharge status: Home / Self Care | Payer: 59 | Attending: Obstetrics and Gynecology | Admitting: Obstetrics and Gynecology

## 2020-09-16 ENCOUNTER — Encounter
Admission: RE | Disposition: A | Discharge status: Home / Self Care | Payer: Self-pay | Source: Home / Self Care | Attending: Obstetrics and Gynecology

## 2020-09-16 ENCOUNTER — Encounter: Payer: Self-pay | Admitting: Obstetrics and Gynecology

## 2020-09-16 ENCOUNTER — Other Ambulatory Visit: Payer: Self-pay

## 2020-09-16 DIAGNOSIS — D62 Acute posthemorrhagic anemia: Secondary | ICD-10-CM | POA: Diagnosis not present

## 2020-09-16 DIAGNOSIS — Z20822 Contact with and (suspected) exposure to covid-19: Secondary | ICD-10-CM | POA: Diagnosis present

## 2020-09-16 DIAGNOSIS — O34211 Maternal care for low transverse scar from previous cesarean delivery: Secondary | ICD-10-CM | POA: Diagnosis present

## 2020-09-16 DIAGNOSIS — Z302 Encounter for sterilization: Secondary | ICD-10-CM | POA: Diagnosis not present

## 2020-09-16 DIAGNOSIS — Z3A39 39 weeks gestation of pregnancy: Secondary | ICD-10-CM | POA: Diagnosis not present

## 2020-09-16 DIAGNOSIS — Z87891 Personal history of nicotine dependence: Secondary | ICD-10-CM

## 2020-09-16 DIAGNOSIS — O34219 Maternal care for unspecified type scar from previous cesarean delivery: Secondary | ICD-10-CM | POA: Diagnosis present

## 2020-09-16 DIAGNOSIS — O9081 Anemia of the puerperium: Secondary | ICD-10-CM | POA: Diagnosis not present

## 2020-09-16 DIAGNOSIS — Z9889 Other specified postprocedural states: Secondary | ICD-10-CM

## 2020-09-16 SURGERY — Surgical Case
Anesthesia: Spinal | Laterality: Bilateral

## 2020-09-16 MED ORDER — DIBUCAINE (PERIANAL) 1 % EX OINT: 1.0000 "application " | TOPICAL_OINTMENT | CUTANEOUS | Status: DC | PRN

## 2020-09-16 MED ORDER — KETOROLAC TROMETHAMINE 30 MG/ML IJ SOLN
INTRAMUSCULAR | Status: DC | PRN
  Administered 2020-09-16: 30 mg via INTRAVENOUS

## 2020-09-16 MED ORDER — KETOROLAC TROMETHAMINE 30 MG/ML IJ SOLN: 30.0000 mg | Freq: Four times a day (QID) | INTRAMUSCULAR | Status: AC | PRN

## 2020-09-16 MED ORDER — DIPHENHYDRAMINE HCL 25 MG PO CAPS: 25.0000 mg | ORAL_CAPSULE | ORAL | Status: DC | PRN

## 2020-09-16 MED ORDER — MENTHOL 3 MG MT LOZG
1.0000 | LOZENGE | OROMUCOSAL | Status: DC | PRN
  Filled 2020-09-16: qty 9

## 2020-09-16 MED ORDER — OXYTOCIN-SODIUM CHLORIDE 30-0.9 UT/500ML-% IV SOLN: 2.5000 [IU]/h | INTRAVENOUS | Status: AC

## 2020-09-16 MED ORDER — NALOXONE HCL 0.4 MG/ML IJ SOLN: 0.4000 mg | INTRAMUSCULAR | Status: DC | PRN

## 2020-09-16 MED ORDER — METHYLERGONOVINE MALEATE 0.2 MG/ML IJ SOLN
INTRAMUSCULAR | Status: DC | PRN
  Administered 2020-09-16: .2 mg via INTRAMUSCULAR

## 2020-09-16 MED ORDER — TETANUS-DIPHTH-ACELL PERTUSSIS 5-2.5-18.5 LF-MCG/0.5 IM SUSY: 0.5000 mL | PREFILLED_SYRINGE | Freq: Once | INTRAMUSCULAR | Status: DC

## 2020-09-16 MED ORDER — MORPHINE SULFATE (PF) 0.5 MG/ML IJ SOLN
INTRAMUSCULAR | Status: AC
  Filled 2020-09-16: qty 10

## 2020-09-16 MED ORDER — MORPHINE SULFATE (PF) 0.5 MG/ML IJ SOLN
INTRAMUSCULAR | Status: DC | PRN
  Administered 2020-09-16: .1 mg via EPIDURAL

## 2020-09-16 MED ORDER — NALBUPHINE HCL 10 MG/ML IJ SOLN: 5.0000 mg | INTRAMUSCULAR | Status: DC | PRN

## 2020-09-16 MED ORDER — CARBOPROST TROMETHAMINE 250 MCG/ML IM SOLN
INTRAMUSCULAR | Status: AC
  Filled 2020-09-16: qty 1

## 2020-09-16 MED ORDER — BUPIVACAINE IN DEXTROSE 0.75-8.25 % IT SOLN
INTRATHECAL | Status: DC | PRN
  Administered 2020-09-16: 1.6 mL via INTRATHECAL

## 2020-09-16 MED ORDER — ACETAMINOPHEN 500 MG PO TABS
1000.0000 mg | ORAL_TABLET | Freq: Four times a day (QID) | ORAL | Status: DC
  Administered 2020-09-16 – 2020-09-17 (×3): 1000 mg via ORAL
  Filled 2020-09-16 (×3): qty 2

## 2020-09-16 MED ORDER — SENNOSIDES-DOCUSATE SODIUM 8.6-50 MG PO TABS
2.0000 | ORAL_TABLET | Freq: Every day | ORAL | Status: DC
  Administered 2020-09-17 – 2020-09-18 (×2): 2 via ORAL
  Filled 2020-09-16 (×2): qty 2

## 2020-09-16 MED ORDER — WITCH HAZEL-GLYCERIN EX PADS: 1.0000 "application " | MEDICATED_PAD | CUTANEOUS | Status: DC | PRN

## 2020-09-16 MED ORDER — MISOPROSTOL 200 MCG PO TABS
ORAL_TABLET | ORAL | Status: AC
  Filled 2020-09-16: qty 5

## 2020-09-16 MED ORDER — SODIUM CHLORIDE 0.9 % IR SOLN
Status: DC | PRN
  Administered 2020-09-16: 50 mL

## 2020-09-16 MED ORDER — FENTANYL CITRATE (PF) 100 MCG/2ML IJ SOLN
INTRAMUSCULAR | Status: AC
  Filled 2020-09-16: qty 2

## 2020-09-16 MED ORDER — BUPIVACAINE LIPOSOME 1.3 % IJ SUSP
INTRAMUSCULAR | Status: DC | PRN
  Administered 2020-09-16: 20 mL

## 2020-09-16 MED ORDER — OXYTOCIN-SODIUM CHLORIDE 30-0.9 UT/500ML-% IV SOLN
INTRAVENOUS | Status: AC
  Administered 2020-09-16: 2.5 [IU]/h via INTRAVENOUS
  Filled 2020-09-16: qty 500

## 2020-09-16 MED ORDER — HYDROCODONE-ACETAMINOPHEN 5-325 MG PO TABS
1.0000 | ORAL_TABLET | ORAL | Status: DC | PRN
  Administered 2020-09-17 – 2020-09-18 (×6): 2 via ORAL
  Filled 2020-09-16 (×6): qty 2

## 2020-09-16 MED ORDER — ONDANSETRON HCL 4 MG/2ML IJ SOLN
INTRAMUSCULAR | Status: DC | PRN
  Administered 2020-09-16 (×2): 4 mg via INTRAVENOUS

## 2020-09-16 MED ORDER — PHENYLEPHRINE HCL (PRESSORS) 10 MG/ML IV SOLN
INTRAVENOUS | Status: DC | PRN
  Administered 2020-09-16: 100 ug via INTRAVENOUS

## 2020-09-16 MED ORDER — PRENATAL MULTIVITAMIN CH
1.0000 | ORAL_TABLET | Freq: Every day | ORAL | Status: DC
  Administered 2020-09-16 – 2020-09-17 (×2): 1 via ORAL
  Filled 2020-09-16 (×2): qty 1

## 2020-09-16 MED ORDER — OXYCODONE HCL 5 MG PO TABS: 5.0000 mg | ORAL_TABLET | ORAL | Status: DC | PRN

## 2020-09-16 MED ORDER — MEPERIDINE HCL 25 MG/ML IJ SOLN: 6.2500 mg | INTRAMUSCULAR | Status: DC | PRN

## 2020-09-16 MED ORDER — ZOLPIDEM TARTRATE 5 MG PO TABS: 5.0000 mg | ORAL_TABLET | Freq: Every evening | ORAL | Status: DC | PRN

## 2020-09-16 MED ORDER — COCONUT OIL OIL: 1.0000 "application " | TOPICAL_OIL | Status: DC | PRN

## 2020-09-16 MED ORDER — SODIUM CHLORIDE (PF) 0.9 % IJ SOLN
INTRAMUSCULAR | Status: AC
  Filled 2020-09-16: qty 50

## 2020-09-16 MED ORDER — ENOXAPARIN SODIUM 40 MG/0.4ML IJ SOSY
40.0000 mg | PREFILLED_SYRINGE | INTRAMUSCULAR | Status: DC
  Administered 2020-09-17 – 2020-09-18 (×2): 40 mg via SUBCUTANEOUS
  Filled 2020-09-16 (×2): qty 0.4

## 2020-09-16 MED ORDER — FENTANYL CITRATE (PF) 100 MCG/2ML IJ SOLN
INTRAMUSCULAR | Status: DC | PRN
  Administered 2020-09-16: 15 ug via INTRAVENOUS

## 2020-09-16 MED ORDER — BUPIVACAINE HCL (PF) 0.5 % IJ SOLN
INTRAMUSCULAR | Status: DC | PRN
  Administered 2020-09-16: 30 mL

## 2020-09-16 MED ORDER — ACETAMINOPHEN 500 MG PO TABS: 1000.0000 mg | ORAL_TABLET | Freq: Four times a day (QID) | ORAL | Status: DC

## 2020-09-16 MED ORDER — DIPHENHYDRAMINE HCL 25 MG PO CAPS: 25.0000 mg | ORAL_CAPSULE | Freq: Four times a day (QID) | ORAL | Status: DC | PRN

## 2020-09-16 MED ORDER — BUPIVACAINE HCL (PF) 0.5 % IJ SOLN
INTRAMUSCULAR | Status: AC
  Filled 2020-09-16: qty 30

## 2020-09-16 MED ORDER — OXYTOCIN-SODIUM CHLORIDE 30-0.9 UT/500ML-% IV SOLN
INTRAVENOUS | Status: DC | PRN
  Administered 2020-09-16: 30 [IU] via INTRAVENOUS

## 2020-09-16 MED ORDER — GABAPENTIN 300 MG PO CAPS
300.0000 mg | ORAL_CAPSULE | Freq: Every day | ORAL | Status: DC
  Administered 2020-09-16 – 2020-09-17 (×2): 300 mg via ORAL
  Filled 2020-09-16 (×2): qty 1

## 2020-09-16 MED ORDER — NALOXONE HCL 4 MG/10ML IJ SOLN
1.0000 ug/kg/h | INTRAVENOUS | Status: DC | PRN
  Filled 2020-09-16: qty 5

## 2020-09-16 MED ORDER — SIMETHICONE 80 MG PO CHEW
80.0000 mg | CHEWABLE_TABLET | ORAL | Status: DC | PRN
  Administered 2020-09-17 – 2020-09-18 (×4): 80 mg via ORAL
  Filled 2020-09-16 (×3): qty 1

## 2020-09-16 MED ORDER — DIPHENHYDRAMINE HCL 50 MG/ML IJ SOLN: 12.5000 mg | INTRAMUSCULAR | Status: DC | PRN

## 2020-09-16 MED ORDER — ONDANSETRON HCL 4 MG/2ML IJ SOLN
INTRAMUSCULAR | Status: AC
  Filled 2020-09-16: qty 2

## 2020-09-16 MED ORDER — BUPIVACAINE LIPOSOME 1.3 % IJ SUSP
INTRAMUSCULAR | Status: AC
  Filled 2020-09-16: qty 20

## 2020-09-16 MED ORDER — ONDANSETRON HCL 4 MG/2ML IJ SOLN: 4.0000 mg | Freq: Three times a day (TID) | INTRAMUSCULAR | Status: DC | PRN

## 2020-09-16 MED ORDER — SIMETHICONE 80 MG PO CHEW
80.0000 mg | CHEWABLE_TABLET | Freq: Three times a day (TID) | ORAL | Status: DC
  Administered 2020-09-16 – 2020-09-18 (×5): 80 mg via ORAL
  Filled 2020-09-16 (×7): qty 1

## 2020-09-16 MED ORDER — KETOROLAC TROMETHAMINE 30 MG/ML IJ SOLN
30.0000 mg | Freq: Four times a day (QID) | INTRAMUSCULAR | Status: AC
  Administered 2020-09-16 – 2020-09-17 (×3): 30 mg via INTRAVENOUS
  Filled 2020-09-16 (×3): qty 1

## 2020-09-16 MED ORDER — IBUPROFEN 600 MG PO TABS
600.0000 mg | ORAL_TABLET | Freq: Four times a day (QID) | ORAL | Status: DC
  Administered 2020-09-17 – 2020-09-18 (×3): 600 mg via ORAL
  Filled 2020-09-16 (×3): qty 1

## 2020-09-16 MED ORDER — NALBUPHINE HCL 10 MG/ML IJ SOLN: 5.0000 mg | Freq: Once | INTRAMUSCULAR | Status: DC | PRN

## 2020-09-16 MED ORDER — LACTATED RINGERS IV SOLN: INTRAVENOUS | Status: DC | PRN

## 2020-09-16 MED ORDER — SODIUM CHLORIDE 0.9 % IV SOLN
INTRAVENOUS | Status: DC | PRN
  Administered 2020-09-16: 50 ug/min via INTRAVENOUS

## 2020-09-16 MED ORDER — KETOROLAC TROMETHAMINE 30 MG/ML IJ SOLN
INTRAMUSCULAR | Status: AC
  Filled 2020-09-16: qty 1

## 2020-09-16 MED ORDER — METHYLERGONOVINE MALEATE 0.2 MG/ML IJ SOLN
INTRAMUSCULAR | Status: AC
  Filled 2020-09-16: qty 1

## 2020-09-16 MED ORDER — SODIUM CHLORIDE 0.9% FLUSH: 3.0000 mL | INTRAVENOUS | Status: DC | PRN

## 2020-09-16 SURGICAL SUPPLY — 37 items
APL PRP STRL LF DISP 70% ISPRP (MISCELLANEOUS) ×1
BARRIER ADHS 3X4 INTERCEED (GAUZE/BANDAGES/DRESSINGS) ×2 IMPLANT
BNDG TENSOPLAST 6X5 (GAUZE/BANDAGES/DRESSINGS) ×1 IMPLANT
BRR ADH 4X3 ABS CNTRL BYND (GAUZE/BANDAGES/DRESSINGS) ×1
CHLORAPREP W/TINT 26 (MISCELLANEOUS) ×2 IMPLANT
COVER WAND RF STERILE (DRAPES) ×2 IMPLANT
DRESSING TELFA 8X3 (GAUZE/BANDAGES/DRESSINGS) ×1 IMPLANT
DRSG CURAFIL 4X4 STRL (GAUZE/BANDAGES/DRESSINGS) IMPLANT
DRSG TELFA 3X8 NADH (GAUZE/BANDAGES/DRESSINGS) ×2 IMPLANT
ELECT CAUTERY BLADE 6.4 (BLADE) ×2 IMPLANT
ELECT REM PT RETURN 9FT ADLT (ELECTROSURGICAL) ×2
ELECTRODE REM PT RTRN 9FT ADLT (ELECTROSURGICAL) ×1 IMPLANT
EXTRACTOR VACUUM KIWI (MISCELLANEOUS) ×1 IMPLANT
GAUZE CURAFIL 4X4 (GAUZE/BANDAGES/DRESSINGS) IMPLANT
GAUZE SPONGE 4X4 12PLY STRL (GAUZE/BANDAGES/DRESSINGS) ×2 IMPLANT
GLOVE SURG SYN 8.0 (GLOVE) ×2 IMPLANT
GLOVE SURG SYN 8.0 PF PI (GLOVE) ×1 IMPLANT
GOWN STRL REUS W/ TWL LRG LVL3 (GOWN DISPOSABLE) ×2 IMPLANT
GOWN STRL REUS W/ TWL XL LVL3 (GOWN DISPOSABLE) ×1 IMPLANT
GOWN STRL REUS W/TWL LRG LVL3 (GOWN DISPOSABLE) ×4
GOWN STRL REUS W/TWL XL LVL3 (GOWN DISPOSABLE) ×2
MANIFOLD NEPTUNE II (INSTRUMENTS) ×2 IMPLANT
MAT PREVALON FULL STRYKER (MISCELLANEOUS) ×2 IMPLANT
NEEDLE HYPO 22GX1.5 SAFETY (NEEDLE) ×2 IMPLANT
NS IRRIG 1000ML POUR BTL (IV SOLUTION) ×2 IMPLANT
PACK C SECTION AR (MISCELLANEOUS) ×2 IMPLANT
PAD DRESSING TELFA 3X8 NADH (GAUZE/BANDAGES/DRESSINGS) ×1 IMPLANT
PAD OB MATERNITY 4.3X12.25 (PERSONAL CARE ITEMS) ×2 IMPLANT
PAD PREP 24X41 OB/GYN DISP (PERSONAL CARE ITEMS) ×2 IMPLANT
RETRACTOR TRAXI PANNICULUS (MISCELLANEOUS) IMPLANT
STRAP SAFETY 5IN WIDE (MISCELLANEOUS) ×2 IMPLANT
SUT CHROMIC 1 CTX 36 (SUTURE) ×6 IMPLANT
SUT CHROMIC 2 0 CT 1 (SUTURE) ×1 IMPLANT
SUT PLAIN GUT 0 (SUTURE) ×4 IMPLANT
SUT VIC AB 0 CT1 36 (SUTURE) ×4 IMPLANT
SYR 30ML LL (SYRINGE) ×4 IMPLANT
TRAXI PANNICULUS RETRACTOR (MISCELLANEOUS) ×1

## 2020-09-16 NOTE — Lactation Note (Signed)
This note was copied from a baby's chart. Lactation Consultation Note  Patient Name: Ashlee [place] Date: 09/16/2020 Reason for consult: Follow-up assessment;Mother's request;Term;Other (Comment) (LGA/c-section) Age:34 hours  Mom called out for breastfeeding assistance- attempted feed on R breast. Mom's R nipple is flat/inverted. Size 76mm nipple shield placed. Baby put in football hold, somewhat sleepy but accepted and sucked for 1-2 minutes before falling asleep. Steps taken to stimulate baby unsuccessful. Left skin to skin with mom.  Mom encouraged to attempt on R breast at next feeding with nipple shield, encouraged continued hand expression and collection as needed.  Encouraged to call out with questions and for ongoing BF assistance.  Maternal Data Has patient been taught Hand Expression?: Yes Does the patient have breastfeeding experience prior to this delivery?: Yes  Feeding Mother's Current Feeding Choice: Breast Milk  LATCH Score Latch:  (sleepy)  Audible Swallowing: A few with stimulation  Type of Nipple: Everted at rest and after stimulation  Comfort (Breast/Nipple): Soft / non-tender  Hold (Positioning): Assistance needed to correctly position infant at breast and maintain latch.  LATCH Score: 7   Lactation Tools Discussed/Used    Interventions Interventions: Breast feeding basics reviewed;Assisted with latch;Hand express;Support pillows;Education (nipple shield)  Discharge Pump: Personal;Manual [facility])  Consult Status Consult Status: Follow-up Date: 09/16/20 Follow-up type: In-patient    Danford Bad 09/16/2020, 3:56 PM

## 2020-09-16 NOTE — Transfer of Care (Signed)
Immediate Anesthesia Transfer of Care Note  Patient: Ashlee [place]  Procedure(s) Performed: REPEAT CESAREAN SECTION WITH BILATERAL TUBAL LIGATION (Bilateral )  Patient Location: PACU  Anesthesia Type:Spinal  Level of Consciousness: awake, alert  and oriented  Airway & Oxygen Therapy: Patient Spontanous Breathing  Post-op Assessment: Report given to RN and Post -op Vital signs reviewed and stable  Post vital signs: Reviewed and stable  Last Vitals:  Vitals Value Taken Time  BP 108/89 09/16/20 0926  Temp    Pulse 92 09/16/20 0926  Resp 26 09/16/20 0926  SpO2 97 % 09/16/20 0926    Last Pain:  Vitals:   09/16/20 0926  PainSc: 0-No pain         Complications: No complications documented.

## 2020-09-16 NOTE — Anesthesia Procedure Notes (Addendum)
Spinal  Patient location during procedure: OR Start time: 09/16/2020 7:47 AM End time: 09/16/2020 7:51 AM Reason for block: surgical anesthesia Staffing Performed: other anesthesia staff  Anesthesiologist: Yevette Edwards, MD Resident/CRNA: Karoline Caldwell, CRNA Other anesthesia staff: Waunita Schooner, RN Preanesthetic Checklist Completed: patient identified, IV checked, site marked, risks and benefits discussed, surgical consent, monitors and equipment checked, pre-op evaluation and timeout performed Spinal Block Patient position: sitting Prep: DuraPrep Patient monitoring: heart rate, cardiac monitor, continuous pulse ox and blood pressure Approach: midline Location: L3-4 Injection technique: single-shot Needle Needle type: Pencan  Needle gauge: 24 G Needle length: 9 cm Assessment Sensory level: T4 Events: CSF return

## 2020-09-16 NOTE — Lactation Note (Signed)
This note was copied from a baby's chart. Lactation Consultation Note  Patient Name: Ashlee [place] [id number] Date: 09/16/2020 Reason for consult: L&D Initial assessment;Initial assessment;Term;Other (Comment) (LGA, rpt c/s) Age:34 hours  Initial lactation visit in L&D. Mom is G2P2 delivered via repeat c-section. Mom provided breastmilk to her first baby, now 10 years old, via exclusive pumping due to baby never interested/unsuccessful latching.  Baby Ashlee Bodkins is LGA, 9lb11.9oz, and will have a sugar check post first feeding. LC at bedside, mom reports an inverted right nipple and everted left, over supply with first baby, and ability to easily express colostrum. Mom opts for first feeding attempt to be in cross-cradle, stating that football never worked well with her first. Pecola Leisure somewhat interested but didn't stay eager, placed back skin to skin while LC hand expressed and captured almost 52mL of colostrum from L breast. Baby began to cue again, this time we opted for modified football hold on L breast- After a few attempts with flowing colostrum, baby sustained latch for almost 10 minutes, a few swallows noted, but stimulation and breast compression/massage needed to keep baby going. Baby unlatched on his own and was asleep at mom's breast- moved back to chest without waking.  LC reviewed newborn breastfeeding basics: early feeding cues, feeding on demand, newborn feeding patterns and behaviors, ongoing hand expression, and attempts at least 8-12x in first 24 hours. LC discussed how sugar level will determine feeding plan for today, and parents verbalize understanding. Transition RN updated, plans to check sugar around 11:00-11:05am.  Maternal Data Has patient been taught Hand Expression?: Yes Does the patient have breastfeeding experience prior to this delivery?: Yes How long did the patient breastfeed?: 11 months  Feeding Mother's Current Feeding Choice: Breast Milk  LATCH Score Latch:  Repeated attempts needed to sustain latch, nipple held in mouth throughout feeding, stimulation needed to elicit sucking reflex.  Audible Swallowing: A few with stimulation  Type of Nipple: Everted at rest and after stimulation  Comfort (Breast/Nipple): Soft / non-tender  Hold (Positioning): Assistance needed to correctly position infant at breast and maintain latch.  LATCH Score: 7   Lactation Tools Discussed/Used    Interventions Interventions: Breast feeding basics reviewed;Assisted with latch;Hand express;Adjust position;Support pillows;Position options;Education;Skin to skin;Breast massage;Breast compression  Discharge Pump: Personal  Consult Status Consult Status: Follow-up Date: 09/16/20 Follow-up type: In-patient    Danford Bad 09/16/2020, 10:43 AM

## 2020-09-16 NOTE — Lactation Note (Signed)
This note was copied from a baby's chart. Lactation Consultation Note  Patient Name: Ashlee [place] [id number] Date: 09/16/2020 Reason for consult: Follow-up assessment;Mother's request;Term;Other (Comment) (LGA, c/s) Age:34 hours  Lactation follow-up once on MBU.  LC assist with baby brought to mom's L breast in football hold, after a few attempts sustained latch, audible swallows identified and pointed out to parents, mom notes tugging/no pain or discomfort. Second BS (pre-feed) was 54. No more testing unless infant becomes symptomatic.  LC assisted with hand expression on R breast, nipple is flat/inverted but does evert with stimulation; however, mom notes that not enough for successful latch- at least with her first child. About 45mL's obtained with hand expression over 5 minutes, mom encouraged to hand express on R breast again in the next 1-2 hours, and call for next feeding. LC recommends nipple shield, after trial of latching on R breasts, size 82mm recommended and will be available at next feeding.  Again, reviewed with parents early feeding cues, positioning and alignment, sandwiching of breast tissue in direction of smile, skin to skin, and calling for support as needed.  Whiteboard updated with LC name/number.  Maternal Data Has patient been taught Hand Expression?: Yes Does the patient have breastfeeding experience prior to this delivery?: Yes How long did the patient breastfeed?: 11 months  Feeding Mother's Current Feeding Choice: Breast Milk  LATCH Score Latch: Repeated attempts needed to sustain latch, nipple held in mouth throughout feeding, stimulation needed to elicit sucking reflex.  Audible Swallowing: A few with stimulation  Type of Nipple: Everted at rest and after stimulation  Comfort (Breast/Nipple): Soft / non-tender  Hold (Positioning): Assistance needed to correctly position infant at breast and maintain latch.  LATCH Score: 7   Lactation Tools  Discussed/Used    Interventions Interventions: Breast feeding basics reviewed;Assisted with latch;Hand express;Adjust position;Position options;Support pillows;Education  Discharge Pump: Personal  Consult Status Consult Status: Follow-up Date: 09/16/20 Follow-up type: In-patient    Danford Bad 09/16/2020, 1:36 PM

## 2020-09-16 NOTE — Brief Op Note (Signed)
09/16/2020  9:11 AM  PATIENT:  Ashlee [place]  35 y.o. female  PRE-OPERATIVE DIAGNOSIS:  elective sterilization, prior cesarean  POST-OPERATIVE DIAGNOSIS:  elective sterilization, prior cesarean  PROCEDURE:  Procedure(s): REPEAT CESAREAN SECTION WITH BILATERAL TUBAL LIGATION (Bilateral)  SURGEON:  Surgeon(s) and Role:    * Kadie Balestrieri, Ihor Austin, MD - Primary  PHYSICIAN ASSISTANT: Margaretmary Eddy , CNM   ASSISTANTS: none   ANESTHESIA:   spinal  EBL:  1300 mL IOF 1500 ou 60 cc BLOOD ADMINISTERED:none  DRAINS: Urinary Catheter (Foley)   LOCAL MEDICATIONS USED:  MARCAINE    and BUPIVICAINE   SPECIMEN:  Source of Specimen:  portion right and left tube  DISPOSITION OF SPECIMEN:  PATHOLOGY  COUNTS:  YES  TOURNIQUET:  * No tourniquets in log *  DICTATION: .Other Dictation: Dictation Number verbal  PLAN OF CARE: Admit to inpatient   PATIENT DISPOSITION:  PACU - hemodynamically stable.   Delay start of Pharmacological VTE agent (>24hrs) due to surgical blood loss or risk of bleeding: not applicable

## 2020-09-16 NOTE — Progress Notes (Signed)
Pt is ready for repeat ltcs and BTL . LAbs reviewed . All questions answered . Proceed

## 2020-09-16 NOTE — Discharge Summary (Signed)
Obstetrical Discharge Summary  Patient Name: Ashlee [place] DOB: [birth year] MRN: [id number]  Date of Admission: 09/16/2020 Date of Delivery: 09/16/20 Delivered by: Beverly Gust MD Date of Discharge: 09/18/20 Primary OB: [facility]  TOI:[id number] last menstrual period was 12/18/2019. EDC Estimated Date of Delivery: 09/23/20 Gestational Age at Delivery: [redacted]w[redacted]d   Antepartum complications: as below Admitting Diagnosis: 39+0 elective repeat c/s and BTL Secondary Diagnosis: Patient Active Problem List   Diagnosis Date Noted   Previous cesarean delivery affecting pregnancy 09/16/2020   PPH (postpartum hemorrhage) 09/16/2020   Postoperative state 09/16/2020   Anal or rectal pain 09/11/2020   Thrombocytopenia (HCC) 03/19/2020   Status post cesarean section 01/04/2016   BMI 50.0-59.9, adult (HCC) 01/02/2016   Anxiety disorder 10/16/2014    Augmentation: N/A Complications: None Intrapartum complications/course: see Op note Date of Delivery: 09/16/20 Delivered By: Beverly Gust MD Delivery Type: primary cesarean section, low transverse incision Anesthesia: spinal Placenta: manual Laceration: none Episiotomy: none  Newborn Data: Live born female  Birth Weight: 9 lb 11.9 oz (4420 g) APGAR: 9, 9  Newborn Delivery   Birth date/time: 09/16/2020 08:20:00 Delivery type: C-Section, Vacuum Assisted Trial of labor: No C-section categorization: Repeat        Postpartum Procedures: none  Edinburgh:  Edinburgh Postnatal Depression Scale Screening Tool 09/17/2020  I have been able to laugh and see the funny side of things. 0  I have looked forward with enjoyment to things. 0  I have blamed myself unnecessarily when things went wrong. 0  I have been anxious or worried for no good reason. 0  I have felt scared or panicky for no good reason. 0  Things have been getting on top of me. 0  I have been so unhappy that I have had difficulty sleeping. 0  I have felt sad or miserable.  0  I have been so unhappy that I have been crying. 0  The thought of harming myself has occurred to me. 0  Edinburgh Postnatal Depression Scale Total 0    Post partum course: Cesarean Section):  Patient had an uncomplicated postpartum course.  By time of discharge on POD#2, her pain was controlled on oral pain medications; she had appropriate lochia and was ambulating, voiding without difficulty, tolerating regular diet and passing flatus.   She was deemed stable for discharge to home.    Discharge Physical Exam:  BP 132/83 (BP Location: Left Arm)   Pulse 100   Temp 97.9 F (36.6 C) (Oral)   Resp 18   Ht 5\' 5"  (1.651 m)   Wt 127.5 kg   LMP 12/18/2019   SpO2 98% Comment: Room Air  Breastfeeding Yes   BMI 46.78 kg/m   General: NAD CV: RRR Pulm: CTABL, nl effort ABD: s/nd/nt, fundus firm and below the umbilicus Lochia: moderate Incision: c/d/i DVT Evaluation: LE non-ttp, no evidence of DVT on exam.  Hemoglobin  Date Value Ref Range Status  09/17/2020 9.4 (L) 12.0 - 15.0 g/dL Final  11/17/2020 98/33/8250 11.1 - 15.9 g/dL Final   HCT  Date Value Ref Range Status  09/17/2020 28.7 (L) 36.0 - 46.0 % Final   Hematocrit  Date Value Ref Range Status  12/14/2017 40.4 34.0 - 46.6 % Final     Disposition: stable, discharge to home. Baby Feeding: breastmilk and formula Baby Disposition: home with mom  Rh Immune globulin given: n/a Rubella vaccine given: immune Tdap vaccine given in AP or PP setting: 06/2020 Flu vaccine given in AP or PP  setting: 01/2020  Contraception:BTL completed 09/16/20  Prenatal Labs:  ABO, Rh:  o+ Antibody:  neg Rubella:  Imm/ vaicella : imm RPR:   nr HBsAg:   neg HIV:   neg GBS:   negative    Plan:  AMYRE SEGUNDO was discharged to home in good condition. Follow-up appointment with delivering provider in 6 weeks.  Discharge Medications: Allergies as of 09/18/2020       Reactions   Benadryl [diphenhydramine] Other (See Comments)   HR slowed,  and BP dropped   Oxycodone-acetaminophen Other (See Comments)   Pt expressed that she received for mouth sx years ago- pain med was such- and pt became incapcitated   Paroxetine    Jaw clenching, muscle spasms, couldn't sleep   Penicillins Other (See Comments)   Pt states occurred as a baby, does not know reaction, but avoids taking such   Nickel Rash        Medication List     TAKE these medications    acetaminophen 325 MG tablet Commonly known as: TYLENOL Take 650 mg by mouth every 6 (six) hours as needed for moderate pain.   coconut oil Oil Apply 1 application topically as needed.   HYDROcodone-acetaminophen 5-325 MG tablet Commonly known as: NORCO/VICODIN Take 1 tablet by mouth every 6 (six) hours as needed for up to 5 days for moderate pain. What changed: reasons to take this   ibuprofen 600 MG tablet Commonly known as: ADVIL Take 1 tablet (600 mg total) by mouth every 6 (six) hours.   Prenatal 28-0.8 MG Tabs Take 1 tablet by mouth daily.   senna-docusate 8.6-50 MG tablet Commonly known as: Senokot-S Take 2 tablets by mouth daily. Start taking on: September 19, 2020   simethicone 80 MG chewable tablet Commonly known as: MYLICON Chew 1 tablet (80 mg total) by mouth 3 (three) times daily after meals.         Follow-up Information     Schermerhorn, Ihor Austin, MD. Go on 10/08/2020.   Specialty: Obstetrics and Gynecology Why: Post op appointment June 30th at 3:45pm  Please call to schedule 6 week postpartum appointment Contact information: [address] [place] [address] [phone]                 Signed: Randa Ngo, CNM 09/18/2020 11:31 AM

## 2020-09-16 NOTE — Anesthesia Preprocedure Evaluation (Addendum)
Anesthesia Evaluation  Patient identified by MRN, date of birth, ID band Patient awake    Reviewed: Allergy & Precautions, H&P , NPO status , Patient's Chart, lab work & pertinent test results, reviewed documented beta blocker date and time   Airway Mallampati: IV   Neck ROM: full    Dental  (+) Teeth Intact   Pulmonary neg pulmonary ROS, former smoker,    Pulmonary exam normal        Cardiovascular Exercise Tolerance: Good negative cardio ROS Normal cardiovascular exam Rhythm:regular Rate:Normal     Neuro/Psych Anxiety negative neurological ROS  negative psych ROS   GI/Hepatic negative GI ROS, Neg liver ROS,   Endo/Other  Morbid obesity  Renal/GU negative Renal ROS  negative genitourinary   Musculoskeletal   Abdominal   Peds  Hematology negative hematology ROS (+)   Anesthesia Other Findings Past Medical History: 2012: Anxiety     Comment:  non longer on meds Past Surgical History: 01/04/2016: CESAREAN SECTION; N/A     Comment:  Procedure: CESAREAN SECTION;  Surgeon: Conard Novak, MD;  Location: [facility] ORS;  Service: Obstetrics;                Laterality: N/A; 2014: GALLBLADDER SURGERY 2013: LAPAROSCOPIC CHOLECYSTECTOMY; N/A 2003-07: MOUTH SURGERY; N/A     Comment:  lower teeth removed, manually pulled adult teeth up over              several years BMI    Body Mass Index: 46.78 kg/m     Reproductive/Obstetrics                             Anesthesia Physical Anesthesia Plan  ASA: III  Anesthesia Plan: Spinal   Post-op Pain Management:    Induction:   PONV Risk Score and Plan: 3  Airway Management Planned:   Additional Equipment:   Intra-op Plan:   Post-operative Plan:   Informed Consent: I have reviewed the patients History and Physical, chart, labs and discussed the procedure including the risks, benefits and alternatives for the proposed  anesthesia with the patient or authorized representative who has indicated his/her understanding and acceptance.     Dental Advisory Given  Plan Discussed with: CRNA  Anesthesia Plan Comments:        Anesthesia Quick Evaluation

## 2020-09-16 NOTE — Op Note (Signed)
NAME: Ashlee Wilson, Ashlee Wilson MEDICAL RECORD NO: [id number] ACCOUNT NO: [id number] DATE OF BIRTH: 02/01/1986 FACILITY: [facility] LOCATION: [facility] PHYSICIAN: Suzy Bouchard, MD  Operative Report   DATE OF PROCEDURE: 09/16/2020  PREOPERATIVE DIAGNOSES: 1.  A 39 plus 0 weeks' estimated gestational age. 2.  Elective repeat cesarean section. 3.  Elective sterilization.  POSTOPERATIVE DIAGNOSES: 1.  A 39 plus 0 weeks' estimated gestational age. 2.  Elective repeat cesarean section. 3.  Elective sterilization. 4.  Vigorous female, delivered.  PROCEDURE: 1.  Repeat low transverse cesarean section. 2.  Bilateral tubal ligation - [place].  ANESTHESIA:  Spinal.  SURGEON:  Suzy Bouchard, MD  FIRST ASSISTANT:  Margaretmary Eddy, certified nurse midwife.  INDICATIONS:  This is a 35 year old gravida 2, para 1 patient at 46 plus 0 weeks, has elected for repeat cesarean section and permanent sterilization.  The patient reconfirms the desire for sterilization on the day of the procedure.  DESCRIPTION OF PROCEDURE:  After adequate spinal anesthesia, the patient was placed in dorsal supine position, hip prone on the right side.  The patient's abdomen was prepped and draped in normal sterile fashion.  Timeout was performed.  The patient did  receive 3 grams IV Ancef prior to commencement of the case.  Traxi pannus elevator was utilized for the case.  A Pfannenstiel incision was made two fingerbreadths above the symphysis pubis.  Sharp dissection was used to identify the fascia.  Fascia was  opened in the midline and opened in a transverse fashion.  Superior aspect of the fascia was grasped with Kocher clamps and the recti muscles were dissected free.  The inferior aspect of the fascia was grasped with Kocher clamps and pyramidalis muscle  was dissected free.  Entry into the peritoneal cavity was accomplished sharply.  The vesicouterine peritoneal fold was identified and a bladder flap was created and  the bladder was reflected inferiorly.  Low transverse uterine incision was made.  Upon  entry into the endometrial cavity, clear fluid resulted.  Fetal head was brought to the incision and a Kiwi vacuum was applied to the fetal occiput.  One pull allowed for delivery of the head and the vacuum was removed.  The shoulders and body were  delivered without difficulty.  A large female was then dried on the abdomen.  Female was vigorous during 60-second delayed cord clamping.  Cord was doubly clamped and vigorous female was passed to nursery staff who assigned Apgar scores of 9 and 9.  Fetal  weight 4420 grams.  Placenta was manually delivered and the uterus was exteriorized.  The endometrial cavity was wiped clean with laparotomy tape and the uterine incision was closed with 1 chromic suture in a running locking fashion.  Good approximation  of edges.  Good hemostasis noted.  Attention was directed to the patient's right fallopian tube, which was grasped at the midportion of the fallopian tube and 2 separate 0 plain gut sutures were placed and a 1.5 cm portion of fallopian tube was removed.   Similar procedure was repeated on the patient's left fallopian tube after placing 2 separate 0 plain gut sutures, 1.5 cm portion of fallopian tube was removed.  Good hemostasis.  Posterior cul-de-sac was irrigated and suctioned.  Uterus was placed back  into the abdominal cavity and the paracolic gutters were wiped clean.  Each tubal ligation site appeared hemostatic.  The uterine incision appeared hemostatic.  Interceed was placed over the uterine incision in a T-shaped fashion and the fascia was then  closed with 0 Vicryl suture in a running nonlocking fashion.  Two separate sutures were used.  The subfascial tissues were then injected with a solution of 20 mL of 1.3% Exparel plus 30 mL of 0.5% Marcaine plus 50 mL normal saline.  45 mL solution was  used.  Subcutaneous tissues were irrigated and bovied for hemostasis and given  the subcutaneous tissues measured 5 cm, the dead space was closed with a subcuticular 2-0 chromic suture. The skin was then reapproximated with Insorb absorbable staples.   Good cosmetic effect.  There were no complications.  ESTIMATED BLOOD LOSS: 1300 mL.  INTRAOPERATIVE FLUIDS:  1500 mL.  URINE OUTPUT:  60 mL.  The patient was taken to recovery room in good condition.   SHW D: 09/16/2020 9:39:38 am T: 09/16/2020 10:51:00 am  JOB: [id number]/ [id number]

## 2020-09-17 ENCOUNTER — Encounter: Payer: Self-pay | Admitting: Obstetrics and Gynecology

## 2020-09-17 LAB — CBC
HCT: 28.7 % — ABNORMAL LOW (ref 36.0–46.0)
Hemoglobin: 9.4 g/dL — ABNORMAL LOW (ref 12.0–15.0)
MCH: 26.8 pg (ref 26.0–34.0)
MCHC: 32.8 g/dL (ref 30.0–36.0)
MCV: 81.8 fL (ref 80.0–100.0)
Platelets: 160 10*3/uL (ref 150–400)
RBC: 3.51 MIL/uL — ABNORMAL LOW (ref 3.87–5.11)
RDW: 16.4 % — ABNORMAL HIGH (ref 11.5–15.5)
WBC: 11.4 10*3/uL — ABNORMAL HIGH (ref 4.0–10.5)
nRBC: 0.4 % — ABNORMAL HIGH (ref 0.0–0.2)

## 2020-09-17 LAB — SURGICAL PATHOLOGY

## 2020-09-17 NOTE — Lactation Note (Signed)
This note was copied from a baby's chart. Lactation Consultation Note  Patient Name: Boy Aaron Boeh MVHQI'O Date: 09/17/2020 Reason for consult: Follow-up assessment;Term;Other (Comment) (LGA/c-section) Age:34 hours  Lactation follow-up. Parents report a difficult night, baby sleepy and uninterested in feeding for most of the night. Night shift RN concerned about sleepy behavior, re-checked sugar, 47, but also encouraged syringe feeding throughout the night for intake.   RN overnight also changed size of nipple shield from 24 to 20 telling parents that this was why baby wasn't accepting/interested in eating- although baby accepted and did well with size 24 nipple shield yesterday. Mom does have larger nipples and R nipple is inverted/slightly everts with some stimulation. Mom said the 64mm was "fine" but 78mm felt more comfortable for her.  Baby circumcised this morning, explained to parents this may affect his feeding behaviors for the next 6-8 hours. Mom had just recently pumped about 33mL of colostrum; offered to baby (14mL) via bottle with mom's permission by [employer] (family friend) in paced-bottle style. Discussed continuation of pumping for supply protection, and additional intake as needed.  Feeding plan today:  -Every 3 hours attempt feeding at breast with expressed colostrum in NS for instant gratification. -Continuation of pumping post feedings to aid in supply protection and building. -Supplement to be used as needed for bilirubin levels and promotion of adequate wet/stool diapers.  Next feeding attempt by 12pm if not before. Reviewed hunger cues, and when to call.  Maternal Data Has patient been taught Hand Expression?: Yes Does the patient have breastfeeding experience prior to this delivery?: Yes How long did the patient breastfeed?: 11 months  Feeding Mother's Current Feeding Choice: Breast Milk Nipple Type: Slow - flow  LATCH Score                     Lactation Tools Discussed/Used Tools: Pump Nipple shield size: 20;24 Breast pump type: Double-Electric Breast Pump Pump Education: Setup, frequency, and cleaning;Milk Storage (educated this morning; mom already familiar) Reason for Pumping: supplement Pumping frequency: q 3 hrs Pumped volume: 25 mL  Interventions Interventions: Breast feeding basics reviewed;Hand express;DEBP;Education (feeding plan today)  Discharge Pump: Personal  Consult Status Consult Status: Follow-up Date: 09/17/20 Follow-up type: In-patient    Danford Bad 09/17/2020, 9:31 AM

## 2020-09-17 NOTE — Progress Notes (Signed)
Post Partum Day 1 Subjective: Doing well, no complaints.  Tolerating regular diet, pain with PO meds, voiding and ambulating without difficulty.  No CP SOB Fever,Chills, N/V or leg pain; denies nipple or breast pain, no HA change of vision, RUQ/epigastric pain  Objective: BP 140/64 (BP Location: Right Arm)   Pulse (!) 109 Comment: nurse Maddy B. notified  Temp 98.4 F (36.9 C) (Oral)   Resp 20   Ht 5\' 5"  (1.651 m)   Wt 127.5 kg   LMP 12/18/2019   SpO2 98%   Breastfeeding Yes   BMI 46.78 kg/m    Physical Exam:  General: NAD Breasts: soft/nontender CV: RRR Pulm: nl effort, CTABL Abdomen: soft, NT, BS x 4 Incision: pressure Dsg CDI, no erythema or drainage noted Lochia: small Uterine Fundus: fundus firm and 2 fb below umbilicus DVT Evaluation: no cords, ttp LEs   Recent Labs    09/17/20 0415  HGB 9.4*  HCT 28.7*  WBC 11.4*  PLT 160    Assessment/Plan: 35 y.o. G2P2002 postpartum day # 1  - Continue routine PP care - Lactation consult prn.  - BTL done with surgery.  - Acute blood loss anemia - hemodynamically stable and asymptomatic; start po ferrous sulfate BID with stool softeners  - Immunization status: all Imms up to date    Disposition: Does not desire Dc home today.     33, CNM 09/17/2020  9:05 AM

## 2020-09-17 NOTE — Progress Notes (Signed)
   09/17/20 1345  Clinical Encounter Type  Visited With Patient and family together  Visit Type Initial  Referral From Chaplain  Consult/Referral To Chaplain  Spiritual Encounters  Spiritual Needs Emotional  Chaplain Envi Eagleson visited room 339A, Pt Ashlee Wilson. Pt was accompanied by her husband and her newborn baby girl was at her bedside. I asked how things were going and congratulated them both. Pt stated, well she's getting better and hopefully we will be able to go home soon. I said well I am doing routine rounding and once again blessings and congratulation and if you need anything our spiritual care dept is always available

## 2020-09-17 NOTE — Anesthesia Post-op Follow-up Note (Signed)
  Anesthesia Pain Follow-up Note  Patient: Ashlee Wilson  Day #: 1  Date of Follow-up: 09/17/2020 Time: 7:38 AM  Last Vitals:  Vitals:   09/17/20 0600 09/17/20 0700  BP:    Pulse: (!) 106 (!) 105  Resp:    Temp:    SpO2: 93% 93%    Level of Consciousness: alert  Pain: none   Side Effects:None  Catheter Site Exam:clean, dry, no drainage  Anti-Coag Meds (From admission, onward)   Start     Dose/Rate Route Frequency Ordered Stop   09/17/20 0900  enoxaparin (LOVENOX) injection 40 mg        40 mg Subcutaneous Every 24 hours 09/16/20 1154         Plan: D/C from anesthesia care at surgeon's request  Kaeya Schiffer B [name]

## 2020-09-17 NOTE — Lactation Note (Signed)
This note was copied from a baby's chart. Lactation Consultation Note  Patient Name: Ashlee Wilson Date: 09/17/2020 Reason for consult: Follow-up assessment Age:34 hours  Lactation follow-up.  Baby asleep since 34mL feeding of colostrum this morning.  LC unwrapped baby, baby did wake- brought to mom in football hold with nipple shield- initially baby was disinterested. Colostrum placed in shield, baby did latch and eager until colostrum was gone, fell asleep.  Stimulation reawakened baby, baby did off and on suck at breast without anymore placed in shield for about 5 minutes, a few swallows heard and pointed out to mom. Baby fell asleep again, LC attempted to syringe feed remainder of expressed colostrum; baby did spit up approximately 34mL of the 38mL given. Mom placed baby on chest and fell asleep quickly.  LC encouraged to have baby hold/wrap baby and mom to pump for her 15 minutes; opted to place 37mm breast shield for more comfort with this pump, all set-up for mom at bedside.   Plan: Feeding attempt again at 3pm, or pump if baby remains sleepy/disinterested.  Maternal Data Has patient been taught Hand Expression?: Yes Does the patient have breastfeeding experience prior to this delivery?: Yes How long did the patient breastfeed?: 11 months  Feeding Mother's Current Feeding Choice: Breast Milk Nipple Type: Slow - flow  LATCH Score Latch: Repeated attempts needed to sustain latch, nipple held in mouth throughout feeding, stimulation needed to elicit sucking reflex.  Audible Swallowing: A few with stimulation  Type of Nipple: Everted at rest and after stimulation  Comfort (Breast/Nipple): Soft / non-tender  Hold (Positioning): Assistance needed to correctly position infant at breast and maintain latch.  LATCH Score: 7   Lactation Tools Discussed/Used Tools: Pump;Nipple Shields;61F feeding tube / Syringe Nipple shield size: 20;24 Breast pump type: Double-Electric  Breast Pump Pump Education: Setup, frequency, and cleaning;Milk Storage (educated this morning; mom already familiar) Reason for Pumping: supplement Pumping frequency: q 3 hrs Pumped volume: 25 mL  Interventions Interventions: Breast feeding basics reviewed;Assisted with latch;Hand express;Breast compression;Adjust position;Support pillows;DEBP;Education (nipple shield)  Discharge Pump: Personal  Consult Status Consult Status: Follow-up Date: 09/17/20 Follow-up type: In-patient    Danford Bad 09/17/2020, 12:41 PM

## 2020-09-17 NOTE — Lactation Note (Signed)
This note was copied from a baby's chart. Lactation Consultation Note  Patient Name: Ashlee Wilson ENIDP'O Date: 09/17/2020 Reason for consult: Follow-up assessment;Difficult latch;Term;Other (Comment) (c-section, LGA) Age:34 hours  Lactation follow-up. Mom attempted a feed at 3pm, but stated baby was sleepy. LC was able to wake baby and point out hunger cues. With nipple shield baby latched well, continual stimulation needed for duration of 10 minute feed, audible swallows occasionally heard and pointed out to mom.  Baby still not overly eager to eat, not waking independently, and not becoming fully alert. Mom has 2mL EBM from previous pumping session available to use as supplement if needed, and plans to pump again now post feeding for additional stimulation/milk removal.   Mom prefers for supplement to be given via bottle if volume is appropriate, feels that finger/syringe feeding is "a lot".   Praised mom for continued dedication to giving baby breastmilk and open to breastfeeding support and help.  Encouraged continued frequent feeding attempts, every 3 hours, hand expression and pumping to follow attempts, supplement given as needed.  Maternal Data Has patient been taught Hand Expression?: Yes Does the patient have breastfeeding experience prior to this delivery?: Yes  Feeding Mother's Current Feeding Choice: Breast Milk  LATCH Score Latch: Repeated attempts needed to sustain latch, nipple held in mouth throughout feeding, stimulation needed to elicit sucking reflex.  Audible Swallowing: A few with stimulation  Type of Nipple: Flat (L breast; nipple shield used)  Comfort (Breast/Nipple): Soft / non-tender  Hold (Positioning): Assistance needed to correctly position infant at breast and maintain latch.  LATCH Score: 6   Lactation Tools Discussed/Used Tools: Nipple Dorris Carnes;Pump Nipple shield size: 20 Breast pump type: Double-Electric Breast Pump Reason for Pumping:  stimulation; milk removal  Interventions Interventions: Breast feeding basics reviewed;Assisted with latch;Hand express;Adjust position;Support pillows;Position options;Education  Discharge    Consult Status Consult Status: Follow-up Date: 09/18/20 Follow-up type: In-patient    Danford Bad 09/17/2020, 4:02 PM

## 2020-09-17 NOTE — Anesthesia Postprocedure Evaluation (Signed)
Anesthesia Post Note  Patient: Ashlee Wilson  Procedure(s) Performed: REPEAT CESAREAN SECTION WITH BILATERAL TUBAL LIGATION (Bilateral)  Patient location during evaluation: Mother Baby Anesthesia Type: Spinal Level of consciousness: oriented and awake and alert Pain management: pain level controlled Vital Signs Assessment: post-procedure vital signs reviewed and stable Respiratory status: spontaneous breathing and respiratory function stable Cardiovascular status: blood pressure returned to baseline and stable Postop Assessment: no headache, no backache, no apparent nausea or vomiting and able to ambulate Anesthetic complications: no   No notable events documented.   Last Vitals:  Vitals:   09/17/20 0600 09/17/20 0700  BP:    Pulse: (!) 106 (!) 105  Resp:    Temp:    SpO2: 93% 93%    Last Pain:  Vitals:   09/17/20 0300  TempSrc: Oral  PainSc: 4                  Kou Gucciardo B [name]

## 2020-09-18 MED ORDER — SIMETHICONE 80 MG PO CHEW: 80.0000 mg | CHEWABLE_TABLET | Freq: Three times a day (TID) | ORAL | 0 refills | Status: DC

## 2020-09-18 MED ORDER — IBUPROFEN 600 MG PO TABS: 600.0000 mg | ORAL_TABLET | Freq: Four times a day (QID) | ORAL | 0 refills | Status: DC

## 2020-09-18 MED ORDER — SENNOSIDES-DOCUSATE SODIUM 8.6-50 MG PO TABS: 2.0000 | ORAL_TABLET | Freq: Every day | ORAL | 0 refills | Status: DC

## 2020-09-18 MED ORDER — COCONUT OIL OIL: 1.0000 "application " | TOPICAL_OIL | 0 refills | Status: DC | PRN

## 2020-09-18 MED ORDER — HYDROCODONE-ACETAMINOPHEN 5-325 MG PO TABS: 1.0000 | ORAL_TABLET | Freq: Four times a day (QID) | ORAL | 0 refills | Status: AC | PRN

## 2020-09-18 NOTE — Progress Notes (Signed)
Pt discharged with infant. Discharge instructions, prescriptions, and follow up appointments given to and reviewed with patient. Pt verbalized understanding. To be escorted out by auxillary.  °

## 2020-10-04 ENCOUNTER — Emergency Department
Admission: EM | Admit: 2020-10-04 | Discharge: 2020-10-04 | Disposition: A | Discharge status: Home / Self Care | Payer: 59 | Attending: Emergency Medicine | Admitting: Emergency Medicine

## 2020-10-04 ENCOUNTER — Encounter: Payer: Self-pay | Admitting: Emergency Medicine

## 2020-10-04 ENCOUNTER — Other Ambulatory Visit: Payer: Self-pay

## 2020-10-04 DIAGNOSIS — R000 Tachycardia, unspecified: Secondary | ICD-10-CM | POA: Diagnosis not present

## 2020-10-04 DIAGNOSIS — A419 Sepsis, unspecified organism: Secondary | ICD-10-CM | POA: Diagnosis not present

## 2020-10-04 DIAGNOSIS — Z87891 Personal history of nicotine dependence: Secondary | ICD-10-CM | POA: Insufficient documentation

## 2020-10-04 DIAGNOSIS — N61 Mastitis without abscess: Secondary | ICD-10-CM | POA: Insufficient documentation

## 2020-10-04 DIAGNOSIS — Z20822 Contact with and (suspected) exposure to covid-19: Secondary | ICD-10-CM | POA: Diagnosis not present

## 2020-10-04 DIAGNOSIS — R509 Fever, unspecified: Secondary | ICD-10-CM | POA: Diagnosis present

## 2020-10-04 LAB — COMPREHENSIVE METABOLIC PANEL
ALT: 28 U/L (ref 0–44)
AST: 38 U/L (ref 15–41)
Albumin: 3.6 g/dL (ref 3.5–5.0)
Alkaline Phosphatase: 74 U/L (ref 38–126)
Anion gap: 11 (ref 5–15)
BUN: 15 mg/dL (ref 6–20)
CO2: 23 mmol/L (ref 22–32)
Calcium: 8.3 mg/dL — ABNORMAL LOW (ref 8.9–10.3)
Chloride: 103 mmol/L (ref 98–111)
Creatinine, Ser: 1.03 mg/dL — ABNORMAL HIGH (ref 0.44–1.00)
GFR, Estimated: >60 mL/min (ref >60)
Glucose, Bld: 106 mg/dL — ABNORMAL HIGH (ref 70–99)
Potassium: 3.5 mmol/L (ref 3.5–5.1)
Sodium: 137 mmol/L (ref 135–145)
Total Bilirubin: 1.1 mg/dL (ref 0.3–1.2)
Total Protein: 7.1 g/dL (ref 6.5–8.1)

## 2020-10-04 LAB — CBC WITH DIFFERENTIAL/PLATELET
Abs Immature Granulocytes: 0.07 10*3/uL (ref 0.00–0.07)
Basophils Absolute: 0 10*3/uL (ref 0.0–0.1)
Basophils Relative: 0 %
Eosinophils Absolute: 0 10*3/uL (ref 0.0–0.5)
Eosinophils Relative: 0 %
HCT: 37.6 % (ref 36.0–46.0)
Hemoglobin: 12.1 g/dL (ref 12.0–15.0)
Immature Granulocytes: 1 %
Lymphocytes Relative: 5 %
Lymphs Abs: 0.5 10*3/uL — ABNORMAL LOW (ref 0.7–4.0)
MCH: 26 pg (ref 26.0–34.0)
MCHC: 32.2 g/dL (ref 30.0–36.0)
MCV: 80.7 fL (ref 80.0–100.0)
Monocytes Absolute: 0.5 10*3/uL (ref 0.1–1.0)
Monocytes Relative: 5 %
Neutro Abs: 9.1 10*3/uL — ABNORMAL HIGH (ref 1.7–7.7)
Neutrophils Relative %: 89 %
Platelets: 256 10*3/uL (ref 150–400)
RBC: 4.66 MIL/uL (ref 3.87–5.11)
RDW: 14.8 % (ref 11.5–15.5)
WBC: 10.2 10*3/uL (ref 4.0–10.5)
nRBC: 0 % (ref 0.0–0.2)

## 2020-10-04 LAB — RESP PANEL BY RT-PCR (FLU A&B, COVID) ARPGX2
Influenza A by PCR: NEGATIVE
Influenza B by PCR: NEGATIVE
SARS Coronavirus 2 by RT PCR: NEGATIVE

## 2020-10-04 LAB — PROTIME-INR
INR: 1.2 (ref 0.8–1.2)
Prothrombin Time: 15.5 seconds — ABNORMAL HIGH (ref 11.4–15.2)

## 2020-10-04 LAB — LACTIC ACID, PLASMA: Lactic Acid, Venous: 0.8 mmol/L (ref 0.5–1.9)

## 2020-10-04 MED ORDER — SODIUM CHLORIDE 0.9 % IV SOLN
2.0000 g | Freq: Once | INTRAVENOUS | Status: AC
  Administered 2020-10-04: 2 g via INTRAVENOUS
  Filled 2020-10-04: qty 20

## 2020-10-04 MED ORDER — CEPHALEXIN 500 MG PO CAPS: 500.0000 mg | ORAL_CAPSULE | Freq: Four times a day (QID) | ORAL | 0 refills | Status: AC

## 2020-10-04 MED ORDER — ACETAMINOPHEN 500 MG PO TABS: 1000.0000 mg | ORAL_TABLET | Freq: Four times a day (QID) | ORAL | 2 refills | Status: DC

## 2020-10-04 MED ORDER — LACTATED RINGERS IV BOLUS (SEPSIS)
2000.0000 mL | Freq: Once | INTRAVENOUS | Status: AC
  Administered 2020-10-04: 2000 mL via INTRAVENOUS

## 2020-10-04 MED ORDER — ACETAMINOPHEN 500 MG PO TABS
1000.0000 mg | ORAL_TABLET | ORAL | Status: AC
  Administered 2020-10-04: 1000 mg via ORAL
  Filled 2020-10-04: qty 2

## 2020-10-04 NOTE — ED Provider Notes (Signed)
[facility] Emergency Department Provider Note   ____________________________________________   Event Date/Time   First MD Initiated Contact with Patient 10/04/20 1129     (approximate)  I have reviewed the triage vital signs and the nursing notes.   HISTORY  Chief Complaint Mastitis    HPI Ashlee Wilson is a 35 y.o. female cesarean section about this less than 3 weeks ago which she reports been healing well  She has previously had mastitis.  She reports 2 days now of right breast pain and decreased milk drainage feels like the breast is engorged red hot and is now having fevers and chills.  She went to urgent care and was advised to come to the ER for concerns of significant infection after she received a dose of ibuprofen.  Reports the right breast is sore and moderately painful, continuing to attempt to feed child but she reports she is not really able to gain any milk with pump or otherwise from the right breast for a day now  Patient of Dr. Feliberto GottronSchermerhorn  Not on an antibiotic  Past Medical History:  Diagnosis Date   Anxiety 2012   non longer on meds    Patient Active Problem List   Diagnosis Date Noted   Previous cesarean delivery affecting pregnancy 09/16/2020   PPH (postpartum hemorrhage) 09/16/2020   Postoperative state 09/16/2020   Anal or rectal pain 09/11/2020   Thrombocytopenia (HCC) 03/19/2020   Status post cesarean section 01/04/2016   BMI 50.0-59.9, adult (HCC) 01/02/2016   Anxiety disorder 10/16/2014    Past Surgical History:  Procedure Laterality Date   CESAREAN SECTION N/A 01/04/2016   Procedure: CESAREAN SECTION;  Surgeon: Conard NovakStephen D Jackson, MD;  Location: [facility] ORS;  Service: Obstetrics;  Laterality: N/A;   CESAREAN SECTION WITH BILATERAL TUBAL LIGATION Bilateral 09/16/2020   Procedure: REPEAT CESAREAN SECTION WITH BILATERAL TUBAL LIGATION;  Surgeon: Suzy BouchardSchermerhorn, Thomas J, MD;  Location: [facility] ORS;  Service: Obstetrics;   Laterality: Bilateral;   GALLBLADDER SURGERY  2014   LAPAROSCOPIC CHOLECYSTECTOMY N/A 2013   MOUTH SURGERY N/A 2003-07   lower teeth removed, manually pulled adult teeth up over several years    Prior to Admission medications   Medication Sig Start Date End Date Taking? Authorizing Provider  acetaminophen (TYLENOL) 325 MG tablet Take 650 mg by mouth every 6 (six) hours as needed for moderate pain.    [provider]  coconut oil OIL Apply 1 application topically as needed. 09/18/20   McVey, Prudencio Pairebecca A, CNM  ibuprofen (ADVIL) 600 MG tablet Take 1 tablet (600 mg total) by mouth every 6 (six) hours. 09/18/20   McVey, Prudencio Pairebecca A, CNM  Prenatal 28-0.8 MG TABS Take 1 tablet by mouth daily.    [provider]  senna-docusate (SENOKOT-S) 8.6-50 MG tablet Take 2 tablets by mouth daily. 09/19/20   McVey, Prudencio Pairebecca A, CNM  simethicone (MYLICON) 80 MG chewable tablet Chew 1 tablet (80 mg total) by mouth 3 (three) times daily after meals. 09/18/20   McVey, Prudencio Pairebecca A, CNM    Allergies Benadryl [diphenhydramine], Oxycodone-acetaminophen, Paroxetine, Nickel, and Penicillins  Family History  Problem Relation Age of Onset   Asthma Mother    Hyperlipidemia Father    Prostate cancer Father    Skin cancer Father     Social History Social History   Tobacco Use   Smoking status: Former    Pack years: 0.00    Types: Cigarettes    Quit date: 04/16/2007  Years since quitting: 13.4   Smokeless tobacco: Never  Vaping Use   Vaping Use: Never used  Substance Use Topics   Alcohol use: No    Alcohol/week: 0.0 standard drinks   Drug use: No    Review of Systems Constitutional: Fevers and chills some fatigue Eyes: No visual changes. ENT: No sore throat. Cardiovascular: No chest pain but reports right breast pain redness and tenderness. Respiratory: Denies shortness of breath. Gastrointestinal: No abdominal pain.  C-section site seems to be healing well. Genitourinary: Negative for  dysuria. Musculoskeletal: Negative for back pain. Skin: Redness over the right breast Neurological: Negative for headaches, areas of focal weakness or numbness.    ____________________________________________   PHYSICAL EXAM:  VITAL SIGNS: ED Triage Vitals  Enc Vitals Group     BP 10/04/20 1124 91/66     Pulse Rate 10/04/20 1124 (!) 127     Resp 10/04/20 1124 18     Temp 10/04/20 1124 (!) 102.2 F (39 C)     Temp Source 10/04/20 1124 Oral     SpO2 10/04/20 1124 96 %     Weight 10/04/20 1127 260 lb (117.9 kg)     Height 10/04/20 1127 5\' 4"  (1.626 m)     Head Circumference --      Peak Flow --      Pain Score 10/04/20 1125 4     Pain Loc --      Pain Edu? --      Excl. in GC? --     Constitutional: Alert and oriented.  Feels mild to mildly ill, slightly diaphoretic. Eyes: Conjunctivae are normal. Head: Atraumatic. Nose: No congestion/rhinnorhea. Mouth/Throat: Mucous membranes are moist. Neck: No stridor.  Cardiovascular: Tachycardic rate, regular rhythm. Grossly normal heart sounds.  Good peripheral circulation. Respiratory: Normal respiratory effort.  No retractions. Lungs CTAB. Gastrointestinal: Soft and nontender. No distention. Musculoskeletal: No lower extremity tenderness nor edema. Neurologic:  Normal speech and language. No gross focal neurologic deficits are appreciated.  Skin:  Skin is warm, dry and intact. No rash noted except she has obvious erythema warmth tenderness over the majority of the right breast, only a very small amount of slightly clear drainage from the right nipple.  Examined with nurse 10/06/20. No ovbious area of abcess by exam. Psychiatric: Mood and affect are normal. Speech and behavior are normal.  ____________________________________________   LABS (all labs ordered are listed, but only abnormal results are displayed)  Labs Reviewed  COMPREHENSIVE METABOLIC PANEL - Abnormal; Notable for the following components:      Result Value    Glucose, Bld 106 (*)    Creatinine, Ser 1.03 (*)    Calcium 8.3 (*)    All other components within normal limits  CBC WITH DIFFERENTIAL/PLATELET - Abnormal; Notable for the following components:   Neutro Abs 9.1 (*)    Lymphs Abs 0.5 (*)    All other components within normal limits  PROTIME-INR - Abnormal; Notable for the following components:   Prothrombin Time 15.5 (*)    All other components within normal limits  RESP PANEL BY RT-PCR (FLU A&B, COVID) ARPGX2  CULTURE, BLOOD (ROUTINE X 2)  CULTURE, BLOOD (ROUTINE X 2)  LACTIC ACID, PLASMA  URINALYSIS, COMPLETE (UACMP) WITH MICROSCOPIC   ____________________________________________  EKG   ____________________________________________  RADIOLOGY   ____________________________________________   PROCEDURES  Procedure(s) performed: None  Procedures  Critical Care performed: No  ____________________________________________   INITIAL IMPRESSION / ASSESSMENT AND PLAN / ED COURSE  Pertinent labs &  imaging results that were available during my care of the patient were reviewed by me and considered in my medical decision making (see chart for details).   Right breast pain warmth erythema lack of drainage tenderness and a history of mastitis.  Her clinical exam today seems to suggest significant mastitis with fairly significant involvement of what appears to be the majority the right breast.  She is febrile mildly hypotensive, tachycardic.  Fully awake and alert lactic acid normal and her blood pressure not less than 90 systolic thus no obvious evidence of severe sepsis but she does have evidence of obvious sepsis with source being the right breast  I discussed her case and care with OB/GYN covering Dr. Brennan Bailey, he advises that prior to antibiotic initiation he and the team will come and see and evaluate the patient further prior to starting abx, advsies will have an admitting team plan within 15-30 mins.  Agreeable and  understanding of plan for admission  ----------------------------------------- 12:28 PM on 10/04/2020 ----------------------------------------- Patient seen and evaluated by OB/GYN service Kathrin Ruddy.  Advises initiate Rocephin, fluid resuscitation antipyretic.  The patient herself is very motivated to go home and does not wish to stay in the hospital is absolutely necessary at this point.  She does however may have some vital signs are concerning for significant sepsis though no evidence of severe sepsis.  Hopefully with fluid resuscitation, her young age, antipyretics her vital signs were normalized and if she does in fact improve markedly she may be able to go home with very close follow-up and mastitis treatment recommendations with OB GYN follow-up as per Kathrin Ruddy who is seen and evaluated her.  Current plan is to reassess and reevaluate the patient in a couple hours for disposition decision  ----------------------------------------- 1:53 PM on 10/04/2020 ----------------------------------------- Patient resting comfortably, fluids infusing at this time.  Fully awake and alert.  She is hungry would like something to eat.  She affirms to me that she actually has a follow-up appointment tomorrow at 3:45 PM with Dr. Elenore Paddy (OB).  She appears improved, blood pressure has improved heart rate improving.  Respiratory rate document was 36, but to my exam appears closer to 18.  Vitals:   10/04/20 1400 10/04/20 1441  BP:  (!) 109/51  Pulse: (!) 108 (!) 103  Resp: 18 16  Temp:    SpO2: 93% 98%     Clinical Course as of 10/04/20 1526  Sun Oct 04, 2020  1454 Patient continues to improve, blood pressure now 109 systolic heart rate 102.  Patient feels well reports lightheadedness prior to coming to the ER but reports that is resolved.  She is resting comfortably fully awake and alert.  Ongoing care signed to Dr. Katrinka Blazing, follow-up on reassessment for disposition with anticipated reevaluation in  the ER prior to disposition plan via OB/GYN service Lucile Crater) to see.  [MQ]  1510 A Mackey reassessed, advises ok to discharge and OBGYN service writing Rx for keflex. Has aapointment tomorrow. Advises hypotension post-partum is fairly normal.  [MQ]  1511 Patient offered admission, and patient expresses does not wish for admit and prefer c [MQ]  1512 Close follow-up tomorrow with Dr. Arvella Merles as planned  [MQ]    Clinical Course User Index [MQ] Sharyn Creamer, MD   ----------------------------------------- 3:26 PM on 10/04/2020 ----------------------------------------- Ongoing care and disposition assigned to Dr. Katrinka Blazing.  We will follow-up on reassessment after fluid bolus is complete, approximately 700 mL remaining.  Thereafter if patient is doing well,  continue to feel improved and blood pressures and heart rate reassuring would anticipate discharge to follow-up closely with plan tomorrow actually with lactation service evaluation in the morning as arranged by OB/GYN, as well as with her primary OB/GYN at 3:45 PM tomorrow as planned  ____________________________________________   FINAL CLINICAL IMPRESSION(S) / ED DIAGNOSES  Final diagnoses:  Sepsis, due to unspecified organism, unspecified whether acute organ dysfunction present [facility])  Acute mastitis of right breast        Note:  This document was prepared using Dragon voice recognition software and may include unintentional dictation errors       Sharyn Creamer, MD 10/04/20 1527

## 2020-10-04 NOTE — ED Triage Notes (Signed)
Pt referred from [facility]. Facility reports pt with mastitis to rt breast for several days and concern for infection or sepsis now

## 2020-10-04 NOTE — ED Provider Notes (Signed)
-.    Patient approximately 1500.  Please have providers note for full details regarding patient's initial evaluation assessment.  In brief patient presents little less than 3 weeks postpartum with sepsis from mastitis.  CMP obtained shows no significant electrode or metabolic derangements.  CBC with WBC count of 10.2 and normal hemoglobin.  Lactic acid nonelevated 0.8.  Influenza was negative.  She was treated with Rocephin and fluid resuscitation.  She was seen by OB after extensive discussion by Dr. Jacqualine Code, [facility] and myself decision made for patient to be discharged.  Advised patient that she met sepsis criteria and that medical recommendation was for admission for continued monitoring and fluid resuscitation as needed but patient strongly states she understands this risk that she could decline overnight but wishes to be discharged home with plan to be seen by her OB tomorrow.  I think she has capacity make this decision and understands the risks.  I emphasized that we are happy to admit her and if she changes her mind she may return at any time that she experiences any decline, she is feeling advised her to immediately return to emergency room.  She is amenable to this return precautions.  Discharged with plan to see OB tomorrow.   Lucrezia Starch, MD 10/04/20 [phone]

## 2020-10-04 NOTE — Discharge Instructions (Addendum)
Lactational mastitis -- Mastitis is an inflammation of the breast that is often associated with fever (which might be masked by pain medications), muscle and breast pain, and redness. It is not always caused by an infection, but most people associate it with infection. Mastitis can happen at any time during lactation, but it is most common during the first six weeks after delivery.  Mastitis tends to occur if the nipples are damaged or the breasts stay engorged for too long or do not drain properly. To prevent and treat mastitis, it's important to get these problems under control.  Signs of lactational mastitis:  A firm, red, and tender area of the breast Fever higher than 101?F or 38.5?C Muscle aches, chills, malaise, or flu-like symptoms  Here are some things you can do to manage mastitis: Call [phone] to make an appointment with Lactation Services at [facility].  They will help make sure you are fully emptying your breast and help with breastfeeding and/or pumping concerns.  Take your antibiotics exactly as directed.  You have been prescribed an antibiotic that you will need to take 4 times a day (about every 6 hours) for 14 days.  If you don't start to feel better within two to three days of starting antibiotics, call your healthcare provider. You may need a different antibiotic or may have a different problem. Continue breastfeeding, even while you are being treated, and work on your feeding technique, so that your breasts empty well. Express your breast (latching, pumping, or hand expression) every 2-4 hours (8-12 times in 24 hours).  Do NOT go more than 4 hours without expressing breast.   You can use heat and/or massage the area prior to expression and then apply ice afterwards.  Take a mild pain reliever, such as acetaminophen (sample brand name Tylenol) or ibuprofen (sample brand names: Advil, Motrin) every 6-8 hours to help with pain and fever.  Apply cold compresses or ice packs after  expression.

## 2020-10-04 NOTE — ED Triage Notes (Signed)
Pt in via POV from [facility], sent over for Sepsis concerns associated with mastitis.  Pt is currently breast feeding, reports mastitis symptoms x approximately two days to right breast, reports entire breast is red, engorged, and painful.    Pt febrile, tachycardic upon arrival.  Pt reports being given 800mg  Ibuprofen at [facility].  Tylenol last taken 0730 at home.

## 2020-10-04 NOTE — Consult Note (Signed)
Consult History and Physical   SERVICE: Obstetrics   Patient Name: Ashlee Wilson Patient MRN:   [id number]  CC: breast pain, fever, chills, general malaise   HPI: Ashlee Wilson is a 35 y.o. [id number] with new onset breast pain.  She is currently 2 weeks postpartum following a scheduled repeat LTCS and BTL.  She reports having a clogged milk duct over the past couple of days.  She tried numerous methods to try to relieve it including massage, heat, shower, and hand expression.  Last night she started feeling worse.  Reports body aches, fever-like symptoms, chills, and general malaise that came on suddenly.  Right breast pain and tenderness became significantly worse and she was unable to fully empty her breast.  She originally presented to [facility] for evaluation but was referred to the ED d/t vital signs concerns.     Review of Systems: positives in bold Review of Systems  Constitutional:  Positive for chills, fever and malaise/fatigue.  Respiratory:  Negative for cough, shortness of breath and wheezing.   Cardiovascular:  Negative for chest pain and palpitations.  Gastrointestinal:  Negative for abdominal pain, nausea and vomiting.  Genitourinary:  Negative for dysuria, frequency and urgency.  Musculoskeletal:  Positive for myalgias. Negative for back pain.  Neurological:  Negative for dizziness, weakness and headaches.  Psychiatric/Behavioral:  The patient is not nervous/anxious.    Past Obstetrical History: OB History     Gravida  2   Para  2   Term  2   Preterm      AB      Living  2      SAB      IAB      Ectopic      Multiple  0   Live Births  2           Past Medical History: Past Medical History:  Diagnosis Date   Anxiety 2012   non longer on meds    Past Surgical History:   Past Surgical History:  Procedure Laterality Date   CESAREAN SECTION N/A 01/04/2016   Procedure: CESAREAN SECTION;  Surgeon: Conard Novak, MD;  Location: [facility] ORS;   Service: Obstetrics;  Laterality: N/A;   CESAREAN SECTION WITH BILATERAL TUBAL LIGATION Bilateral 09/16/2020   Procedure: REPEAT CESAREAN SECTION WITH BILATERAL TUBAL LIGATION;  Surgeon: Suzy Bouchard, MD;  Location: [facility] ORS;  Service: Obstetrics;  Laterality: Bilateral;   GALLBLADDER SURGERY  2014   LAPAROSCOPIC CHOLECYSTECTOMY N/A 2013   MOUTH SURGERY N/A 2003-07   lower teeth removed, manually pulled adult teeth up over several years    Family History:  family history includes Asthma in her mother; Hyperlipidemia in her father; Prostate cancer in her father; Skin cancer in her father.  Social History:  reports that she quit smoking about 13 years ago. She has never used smokeless tobacco. She reports that she does not drink alcohol and does not use drugs.   Home Medications:  Medications reconciled in EPIC  No current facility-administered medications on file prior to encounter.   Current Outpatient Medications on File Prior to Encounter  Medication Sig Dispense Refill   coconut oil OIL Apply 1 application topically as needed.  0   ibuprofen (ADVIL) 600 MG tablet Take 1 tablet (600 mg total) by mouth every 6 (six) hours. 30 tablet 0   Prenatal 28-0.8 MG TABS Take 1 tablet by mouth daily.     senna-docusate (SENOKOT-S) 8.6-50 MG tablet Take 2 tablets  by mouth daily. 60 tablet 0   simethicone (MYLICON) 80 MG chewable tablet Chew 1 tablet (80 mg total) by mouth 3 (three) times daily after meals. 30 tablet 0    Allergies:  Allergies  Allergen Reactions   Benadryl [Diphenhydramine] Other (See Comments)    Bradycardia, Hypotension    Oxycodone-Acetaminophen Other (See Comments)    Altered Mental Status   Paroxetine Other (See Comments)    Jaw clenching, muscle spasms, couldn't sleep   Nickel Rash   Penicillins Other (See Comments)    Childhood allergy     Physical Exam:  Temp:  [100.1 F (37.8 C)-102.2 F (39 C)] 100.1 F (37.8 C) (06/26 1321) Pulse Rate:   [99-127] 99 (06/26 1530) Resp:  [16-36] 20 (06/26 1530) BP: (91-109)/(51-66) 93/60 (06/26 1530) SpO2:  [93 %-98 %] 96 % (06/26 1530) Weight:  [117.9 kg] 117.9 kg (06/26 1127)  Physical Exam Constitutional:      Appearance: She is obese. She is ill-appearing.  HENT:     Mouth/Throat:     Mouth: Mucous membranes are moist.  Cardiovascular:     Rate and Rhythm: Tachycardia present.     Pulses: Normal pulses.     Heart sounds: Normal heart sounds.  Pulmonary:     Effort: Pulmonary effort is normal.     Breath sounds: Normal breath sounds.  Chest:  Breasts:    Right: Tenderness present.     Left: Normal.     Comments: Large area of erythema on right breast. Tender and warm to palpation. Engorged. Multiple clogged ducts palpated.  Small blisters noted on nipple. Skin intact.  Abdominal:     Palpations: Abdomen is soft.     Tenderness: There is no abdominal tenderness.  Musculoskeletal:        General: Normal range of motion.     Cervical back: Normal range of motion.  Skin:    General: Skin is warm.  Neurological:     Mental Status: She is alert and oriented to person, place, and time.  Psychiatric:        Mood and Affect: Mood normal.      Labs/Studies:   CBC and Coags:  Lab Results  Component Value Date   WBC 10.2 10/04/2020   NEUTOPHILPCT 89 10/04/2020   EOSPCT 0 10/04/2020   BASOPCT 0 10/04/2020   LYMPHOPCT 5 10/04/2020   HGB 12.1 10/04/2020   HCT 37.6 10/04/2020   MCV 80.7 10/04/2020   PLT 256 10/04/2020   INR 1.2 10/04/2020   CMP:  Lab Results  Component Value Date   NA 137 10/04/2020   K 3.5 10/04/2020   CL 103 10/04/2020   CO2 23 10/04/2020   BUN 15 10/04/2020   CREATININE 1.03 (H) 10/04/2020   CREATININE 0.41 (L) 09/14/2020   PROT 7.1 10/04/2020   BILITOT 1.1 10/04/2020   ALT 28 10/04/2020   AST 38 10/04/2020   ALKPHOS 74 10/04/2020    Other Imaging: No results found.   Assessment / Plan:   Ashlee Wilson is a 35 y.o. [id number] who  presents with mastitis   Clinically improved with IV fluids, acetaminophen, abx, and expression of breasts.  Able to pump > 8 ounces out of each breast. Observation to Woman's unit for further monitoring, abx therapy, and assistance with pumping discussed.  Naleyah declines at this time and would like to discharge home with her family and infant.  This is reasonable request as most cases of mastitis can be managed outpatient.  Will discharge home with oral antibiotics, instructions for comfort measures and care of breast, and plan for outpatient follow up with Lactation services.   Prescription for Keflex 500mg  QID for 14 days sent to pharmacy.  Recommend Acetaminophen 1000 mg every 6 hours and Ibuprofen 600-800 mg every 6-8 hours for pain and fever.   She is exclusively pumping.  Reviewed pumping scheduled.  She will need to pump every 2-4 hours (8-12 times in 24 hours).  Instructed not to go longer than 4 hours without expressing breast.   May use heat and/or massage prior to pumping and instructed to apply ice afterwards.   Lactation services number given - [phone] - instructed to leave message today to set up outpatient appointment this week.  Keep post-op appointment this week with Dr. [phone].     Thank you for the opportunity to be involved with this patient's care.  ----- Feliberto Gottron, CNM Midwife [facility], Department of OB/GYN [facility]

## 2020-10-04 NOTE — ED Notes (Signed)
Certified Midwife at bedside at this time.

## 2020-10-04 NOTE — ED Notes (Signed)
Midwife at bedside for reevaluation.

## 2020-10-04 NOTE — ED Notes (Signed)
Pt given saltines and sprite with MD approval. Pt also given ice pack at this time.

## 2020-10-04 NOTE — ED Notes (Signed)
Dr Fanny Bien, EDP at this time.

## 2020-10-09 LAB — CULTURE, BLOOD (ROUTINE X 2)
Culture: NO GROWTH
Culture: NO GROWTH
Special Requests: ADEQUATE

## 2020-10-14 ENCOUNTER — Other Ambulatory Visit: Payer: Self-pay | Admitting: Obstetrics and Gynecology

## 2020-10-14 ENCOUNTER — Ambulatory Visit: Payer: Self-pay

## 2020-10-14 ENCOUNTER — Encounter: Payer: Self-pay | Admitting: Obstetrics and Gynecology

## 2020-10-14 NOTE — Lactation Note (Signed)
This note was copied from a baby's chart. Lactation Consultation Note  Patient Name: Ashlee Wilson OHYWV'P Date: 10/14/2020 Reason for consult: Follow-up assessment;Other (Comment) (Outpatient) Age:34 wk.o.  Maternal Data   Mom has been treated for the last 10-12 days for ongoing complications related to mastitis, suspected sepsis and yeast infection of the R breast. Mom is taking Keflex, Diflucan per rx.  Feeding Mother's Current Feeding Choice: Breast Milk Lactation Tools Discussed/Used Tools: Pump Breast pump type: Double-Electric Breast Pump Reason for Pumping: exclusive pumping Pumping frequency: q 2-3hrs Pumped volume: 10 mL  Warmth applied, along with size 43mm breast flange. Hospital grade DEBP utilized along with continual warmth, massage, compression, and reverse pressure. Pumped for 20 minutes, with obtainment of 26mL. Mom was in significant pain the entire time.   LC notes a significant amount of yellow tinged crust/sheet/encasing covering entire R nipple that is impacting the ability for milk emptying, and creating significant amount of pain- mom notes stabbing pain within the nipple during any type of stimulation or use of pump.  Mom reports doing the following interventions on her own: -warmth compress -compressions with stimulation -warm showers with hand expression/hand pump use -ice for swelling -tylenol/motrin -cabbage leaves  -epsom salt soaks -dangle pumping -sterilization of pump parts/pieces   Mom does wear disposable breast pads, and changes often due to leaking.   Interventions Interventions: DEBP;Breast compression;Reverse pressure (warmth/ice)  Mom was encouraged to reach back out to provider for possible culture of the area at the nipple, second opinion.  Mom encouraged continuation of current efforts to minimize the effects of mastitis.   Discharge Discharge Education: Outpatient recommendation (second opinion) Pump: Personal  Consult  Status Consult Status: Complete   Danford Bad 10/14/2020, 3:03 PM

## 2020-10-14 NOTE — H&P (Signed)
Ashlee Wilson is an 35 y.o. female. 4 weeks out from LTCS and 10 days ago developed significant right breast mastitis . Keflex qid use has helped , but pt is still with decrease milk production on the right vs the left .  Pertinent Gynecological History:   OB History: G2, P2   Menstrual History: Menarche age:  No LMP recorded.    Past Medical History:  Diagnosis Date   Anxiety 2012   non longer on meds    Past Surgical History:  Procedure Laterality Date   CESAREAN SECTION N/A 01/04/2016   Procedure: CESAREAN SECTION;  Surgeon: Conard Novak, MD;  Location: [facility] ORS;  Service: Obstetrics;  Laterality: N/A;   CESAREAN SECTION WITH BILATERAL TUBAL LIGATION Bilateral 09/16/2020   Procedure: REPEAT CESAREAN SECTION WITH BILATERAL TUBAL LIGATION;  Surgeon: Suzy Bouchard, MD;  Location: [facility] ORS;  Service: Obstetrics;  Laterality: Bilateral;   GALLBLADDER SURGERY  2014   LAPAROSCOPIC CHOLECYSTECTOMY N/A 2013   MOUTH SURGERY N/A 2003-07   lower teeth removed, manually pulled adult teeth up over several years    Family History  Problem Relation Age of Onset   Asthma Mother    Hyperlipidemia Father    Prostate cancer Father    Skin cancer Father     Social History:  reports that she quit smoking about 13 years ago. She has never used smokeless tobacco. She reports that she does not drink alcohol and does not use drugs.  Allergies:  Allergies  Allergen Reactions   Benadryl [Diphenhydramine] Other (See Comments)    Bradycardia, Hypotension    Oxycodone-Acetaminophen Other (See Comments)    Altered Mental Status   Paroxetine Other (See Comments)    Jaw clenching, muscle spasms, couldn't sleep   Nickel Rash   Penicillins Other (See Comments)    Childhood allergy     (Not in a hospital admission)   Review of Systems Review of Systems: A full review of systems was performed and negative except as noted in the HPI.   Eyes: no vision change  Ears: left ear pain   Oropharynx: no sore throat  Pulmonary . No shortness of breath , no hemoptysis Cardiovascular: no chest pain , no irregular heart beat  Gastrointestinal:no blood in stool . No diarrhea, no constipation Uro gynecologic: no dysuria , no pelvic pain Neurologic : no seizure , no migraines    Musculoskeletal: no muscular weakness Skin : + right breat pain  currently breastfeeding. Physical Exam Lungs CTA   CV RRR incision C/D/I  Right breast : slight erythema . + milky breast d/c . +Right breast erythema and exudate right nipple, right breast = size left . No fluctuance +Right breast erythema and exudate right nipple, right breast = size left . No fluctuance  No results found.  Assessment/Plan: Right breast mastitis . Slowly improving , but inflammation probably obstructing milk ducts leading to decreased production  Admission IN abx  Nafcillin 2 gm qid IV . Culture right breast milk  Diflucan 150 mg qod while in the hospital  Ihor Austin Hannia Matchett 10/14/2020, 5:30 PM

## 2020-10-15 ENCOUNTER — Observation Stay: Admission: RE | Admit: 2020-10-15 | Payer: 59 | Source: Ambulatory Visit | Admitting: Obstetrics and Gynecology

## 2021-06-03 ENCOUNTER — Telehealth: Payer: Self-pay

## 2021-06-03 NOTE — Telephone Encounter (Signed)
[facility] referring for [facility] for well women's exam. Paper records in [facility] or midwives. Called and left voicemail for patient to call back to be scheduled.

## 2021-06-07 NOTE — Telephone Encounter (Signed)
Called and left voicemail for patient to call back to be scheduled. 

## 2021-06-08 NOTE — Telephone Encounter (Signed)
Called and left voicemail for patient to call back to be scheduled. 

## 2021-06-09 NOTE — Telephone Encounter (Signed)
Contacted referring provider due to multiple attempts to reach patient was unsuccessful.

## 2021-06-09 NOTE — Telephone Encounter (Signed)
Called and left voicemail for patient to call back to be scheduled. 

## 2021-06-28 ENCOUNTER — Encounter: Payer: Self-pay | Admitting: Nurse Practitioner

## 2021-06-28 ENCOUNTER — Ambulatory Visit (INDEPENDENT_AMBULATORY_CARE_PROVIDER_SITE_OTHER): Payer: BLUE CROSS/BLUE SHIELD | Admitting: Nurse Practitioner

## 2021-06-28 ENCOUNTER — Other Ambulatory Visit: Payer: Self-pay

## 2021-06-28 VITALS — BP 132/85 | HR 97 | Temp 98.3°F | Resp 16 | Ht 64.5 in | Wt 265.0 lb

## 2021-06-28 DIAGNOSIS — D649 Anemia, unspecified: Secondary | ICD-10-CM

## 2021-06-28 DIAGNOSIS — E538 Deficiency of other specified B group vitamins: Secondary | ICD-10-CM

## 2021-06-28 DIAGNOSIS — Z7689 Persons encountering health services in other specified circumstances: Secondary | ICD-10-CM

## 2021-06-28 DIAGNOSIS — R635 Abnormal weight gain: Secondary | ICD-10-CM

## 2021-06-28 DIAGNOSIS — E782 Mixed hyperlipidemia: Secondary | ICD-10-CM | POA: Diagnosis not present

## 2021-06-28 DIAGNOSIS — Z6841 Body Mass Index (BMI) 40.0 and over, adult: Secondary | ICD-10-CM

## 2021-06-28 DIAGNOSIS — R7301 Impaired fasting glucose: Secondary | ICD-10-CM

## 2021-06-28 DIAGNOSIS — E559 Vitamin D deficiency, unspecified: Secondary | ICD-10-CM | POA: Diagnosis not present

## 2021-06-28 LAB — POCT GLYCOSYLATED HEMOGLOBIN (HGB A1C): Hemoglobin A1C: 6.3 % — AB (ref 4.0–5.6)

## 2021-06-28 MED ORDER — PHENTERMINE HCL 37.5 MG PO TABS: 37.5000 mg | ORAL_TABLET | Freq: Every day | ORAL | 0 refills | Status: DC

## 2021-06-28 NOTE — Progress Notes (Signed)
[facility] ?[address] ?[place], [place] [address] ? ?Internal MEDICINE  ?Office Visit Note ? ?Patient Name: Ashlee Wilson ? [id number]  ?[id number] ? ?Date of Service: 06/28/2021 ? ? ?Complaints/HPI ?Pt is here for establishment of PCP. ?Chief Complaint  ?Patient presents with  ? New Patient (Initial Visit)  ?  HPV pos last summer, had leep but needed another leep procedure but then was pregnant before she could get 2nd leep, prediabetic, PCOS  ? Anxiety  ? Weight Loss  ? ?HPI ?Ashlee Wilson presents for a new patient visit to establish care.  She is a well-appearing 36 year old female with PCOS and anxiety.  Her surgical history is significant for C-section x2, cholecystectomy and a LEEP procedure in 2021.  She recently stopped breast-feeding her youngest child.  She was working with a provider at [facility] clinic to help her lose weight.  They had started her on phentermine and metformin but she was not able to start taking it until a week ago because she had to be done with breast-feeding due to the risk it imposes on the child.  Unfortunately her insurance would not cover her going to [facility] anymore so she needed to switch providers. ?Patient reports that she has had rapid weight gain since January she has gained 40 pounds.  She has changed the way she eats and has sent to a caloric restriction under 1300 cal daily and this still has not helped her lose any significant amount of weight.  ? ? ? ? ?Current Medication: ?Outpatient Encounter Medications as of 06/28/2021  ?Medication Sig  ? metFORMIN (GLUCOPHAGE) 500 MG tablet Take 500 mg by mouth 2 (two) times daily.  ? phentermine (ADIPEX-P) 37.5 MG tablet Take 1 tablet (37.5 mg total) by mouth daily before breakfast. May take 1/2 tablet instead of whole tablet id desired,  ? [DISCONTINUED] phentermine 37.5 MG capsule Take 37.5 mg by mouth every morning. Pt takes half a tab in the morning and typically does not take any on the weekends  ? [DISCONTINUED]  acetaminophen (TYLENOL) 500 MG tablet Take 2 tablets (1,000 mg total) by mouth every 6 (six) hours. (Patient not taking: Reported on 06/28/2021)  ? [DISCONTINUED] coconut oil OIL Apply 1 application topically as needed. (Patient not taking: Reported on 06/28/2021)  ? [DISCONTINUED] ibuprofen (ADVIL) 600 MG tablet Take 1 tablet (600 mg total) by mouth every 6 (six) hours. (Patient not taking: Reported on 06/28/2021)  ? [DISCONTINUED] Prenatal 28-0.8 MG TABS Take 1 tablet by mouth daily. (Patient not taking: Reported on 06/28/2021)  ? [DISCONTINUED] senna-docusate (SENOKOT-S) 8.6-50 MG tablet Take 2 tablets by mouth daily. (Patient not taking: Reported on 06/28/2021)  ? [DISCONTINUED] simethicone (MYLICON) 80 MG chewable tablet Chew 1 tablet (80 mg total) by mouth 3 (three) times daily after meals. (Patient not taking: Reported on 06/28/2021)  ? ?No facility-administered encounter medications on file as of 06/28/2021.  ? ? ?Surgical History: ?Past Surgical History:  ?Procedure Laterality Date  ? CESAREAN SECTION N/A 01/04/2016  ? Procedure: CESAREAN SECTION;  Surgeon: Will Bonnet, MD;  Location: [facility] ORS;  Service: Obstetrics;  Laterality: N/A;  ? CESAREAN SECTION WITH BILATERAL TUBAL LIGATION Bilateral 09/16/2020  ? Procedure: REPEAT CESAREAN SECTION WITH BILATERAL TUBAL LIGATION;  Surgeon: Schermerhorn, Gwen Her, MD;  Location: [facility] ORS;  Service: Obstetrics;  Laterality: Bilateral;  ? GALLBLADDER SURGERY  2014  ? LAPAROSCOPIC CHOLECYSTECTOMY N/A 2013  ? MOUTH SURGERY N/A 2003-07  ? lower teeth removed, manually pulled adult teeth up  over several years  ? ? ?Medical History: ?Past Medical History:  ?Diagnosis Date  ? Anxiety 2012  ? non longer on meds  ? PCOS (polycystic ovarian syndrome)   ? ? ?Family History: ?Family History  ?Problem Relation Age of Onset  ? Asthma Mother   ? Kidney disease Father   ? Alcohol abuse Father   ? Hyperlipidemia Father   ? Prostate cancer Father   ? Skin cancer Father   ? ? ?Social  History  ? ?Socioeconomic History  ? Marital status: Married  ?  Spouse name: Clayton Lefort  ? Number of children: Not on file  ? Years of education: Not on file  ? Highest education level: Not on file  ?Occupational History  ? Not on file  ?Tobacco Use  ? Smoking status: Former  ?  Types: Cigarettes  ?  Quit date: 04/16/2007  ?  Years since quitting: 14.2  ? Smokeless tobacco: Never  ?Vaping Use  ? Vaping Use: Never used  ?Substance and Sexual Activity  ? Alcohol use: No  ?  Alcohol/week: 0.0 standard drinks  ? Drug use: No  ? Sexual activity: Yes  ?  Birth control/protection: Surgical  ?Other Topics Concern  ? Not on file  ?Social History Narrative  ? Not on file  ? ?Social Determinants of Health  ? ?Financial Resource Strain: Not on file  ?Food Insecurity: Not on file  ?Transportation Needs: Not on file  ?Physical Activity: Not on file  ?Stress: Not on file  ?Social Connections: Not on file  ?Intimate Partner Violence: Not on file  ? ? ? ?Review of Systems  ?Constitutional:  Negative for chills, fatigue and unexpected weight change.  ?HENT:  Negative for congestion, rhinorrhea, sneezing and sore throat.   ?Eyes:  Negative for redness.  ?Respiratory:  Negative for cough, chest tightness and shortness of breath.   ?Cardiovascular:  Negative for chest pain and palpitations.  ?Gastrointestinal:  Negative for abdominal pain, constipation, diarrhea, nausea and vomiting.  ?Genitourinary:  Negative for dysuria and frequency.  ?Musculoskeletal:  Negative for arthralgias, back pain, joint swelling and neck pain.  ?Skin:  Negative for rash.  ?Neurological: Negative.  Negative for tremors and numbness.  ?Hematological:  Negative for adenopathy. Does not bruise/bleed easily.  ?Psychiatric/Behavioral:  Negative for behavioral problems (Depression), sleep disturbance and suicidal ideas. The patient is not nervous/anxious.   ? ?Vital Signs: ?BP 132/85   Pulse 97   Temp 98.3 ?F (36.8 ?C)   Resp 16   Ht 5' 4.5" (1.638 m)   Wt 265 lb  (120.2 kg)   SpO2 99%   BMI 44.78 kg/m?  ? ? ?Physical Exam ?Vitals reviewed.  ?Constitutional:   ?   General: She is not in acute distress. ?   Appearance: Normal appearance. She is obese. She is not ill-appearing.  ?HENT:  ?   Head: Normocephalic and atraumatic.  ?Eyes:  ?   Pupils: Pupils are equal, round, and reactive to light.  ?Cardiovascular:  ?   Rate and Rhythm: Normal rate and regular rhythm.  ?Pulmonary:  ?   Effort: Pulmonary effort is normal. No respiratory distress.  ?Neurological:  ?   Mental Status: She is alert and oriented to person, place, and time.  ?Psychiatric:     ?   Mood and Affect: Mood normal.     ?   Behavior: Behavior normal.  ? ? ? ? ?Assessment/Plan: ?1. Impaired fasting glucose ?Med ordered, labs ordered. A1C is 6.3 which is  diabetic, repeat A1C in 3 months.  ?- metFORMIN (GLUCOPHAGE) 500 MG tablet; Take 500 mg by mouth 2 (two) times daily. ?- POCT glycosylated hemoglobin (Hb A1C) ?- CMP14+EGFR ? ?2. Mixed hyperlipidemia ?Routine labs ordered  ?- Lipid Profile ? ?3. Rapid weight gain ?Routien labs ordered.  ?- CBC with Differential/Platelet ?- CMP14+EGFR ?- Lipid Profile ?- TSH + free T4 ?- Vitamin D (25 hydroxy) ?- B12 and Folate Panel ?- Iron, TIBC and Ferritin Panel ? ?4. Vitamin D deficiency ?Routine lab ordered.  ?- Vitamin D (25 hydroxy) ? ?5. B12 deficiency ?Routine labs ordered  ?- CBC with Differential/Platelet ?- B12 and Folate Panel ?- Iron, TIBC and Ferritin Panel ? ?6. Anemia, unspecified type ?Routine labs ordered ?- CBC with Differential/Platelet ?- B12 and Folate Panel ?- Iron, TIBC and Ferritin Panel ? ?7. BMI 50.0-59.9, adult ([place]) ?Meds ordered, patient had already started on phentermine and metformin to help her lose weight.  ?- metFORMIN (GLUCOPHAGE) 500 MG tablet; Take 500 mg by mouth 2 (two) times daily. ?- Lipid Profile ?- TSH + free T4 ?- phentermine (ADIPEX-P) 37.5 MG tablet; Take 1 tablet (37.5 mg total) by mouth daily before breakfast. May take 1/2 tablet  instead of whole tablet id desired,  Dispense: 15 tablet; Refill: 0 ? ?8. Encounter to establish care with new doctor ?Need PCP, due for annual labs and routine annual exam ? ? ? ?General Counseling: Torsha verbali

## 2021-06-30 LAB — IRON,TIBC AND FERRITIN PANEL
Ferritin: 27 ng/mL (ref 15–150)
Iron Saturation: 16 % (ref 15–55)
Iron: 54 ug/dL (ref 27–159)
Total Iron Binding Capacity: 341 ug/dL (ref 250–450)
UIBC: 287 ug/dL (ref 131–425)

## 2021-06-30 LAB — CMP14+EGFR
ALT: 14 IU/L (ref 0–32)
AST: 13 IU/L (ref 0–40)
Albumin/Globulin Ratio: 1.6 (ref 1.2–2.2)
Albumin: 4.4 g/dL (ref 3.8–4.8)
Alkaline Phosphatase: 79 IU/L (ref 44–121)
BUN/Creatinine Ratio: 14 (ref 9–23)
BUN: 11 mg/dL (ref 6–20)
Bilirubin Total: 0.4 mg/dL (ref 0.0–1.2)
CO2: 23 mmol/L (ref 20–29)
Calcium: 9.6 mg/dL (ref 8.7–10.2)
Chloride: 102 mmol/L (ref 96–106)
Creatinine, Ser: 0.8 mg/dL (ref 0.57–1.00)
Globulin, Total: 2.7 g/dL (ref 1.5–4.5)
Glucose: 93 mg/dL (ref 70–99)
Potassium: 4.5 mmol/L (ref 3.5–5.2)
Sodium: 140 mmol/L (ref 134–144)
Total Protein: 7.1 g/dL (ref 6.0–8.5)
eGFR: 99 mL/min/{1.73_m2} (ref >59)

## 2021-06-30 LAB — LIPID PANEL
Chol/HDL Ratio: 3.5 ratio (ref 0.0–4.4)
Cholesterol, Total: 152 mg/dL (ref 100–199)
HDL: 43 mg/dL (ref >39)
LDL Chol Calc (NIH): 84 mg/dL (ref 0–99)
Triglycerides: 145 mg/dL (ref 0–149)
VLDL Cholesterol Cal: 25 mg/dL (ref 5–40)

## 2021-06-30 LAB — CBC WITH DIFFERENTIAL/PLATELET
Basophils Absolute: 0 10*3/uL (ref 0.0–0.2)
Basos: 1 %
EOS (ABSOLUTE): 0.1 10*3/uL (ref 0.0–0.4)
Eos: 1 %
Hematocrit: 42 % (ref 34.0–46.6)
Hemoglobin: 13.7 g/dL (ref 11.1–15.9)
Immature Grans (Abs): 0 10*3/uL (ref 0.0–0.1)
Immature Granulocytes: 0 %
Lymphocytes Absolute: 1.7 10*3/uL (ref 0.7–3.1)
Lymphs: 21 %
MCH: 25.7 pg — ABNORMAL LOW (ref 26.6–33.0)
MCHC: 32.6 g/dL (ref 31.5–35.7)
MCV: 79 fL (ref 79–97)
Monocytes Absolute: 0.5 10*3/uL (ref 0.1–0.9)
Monocytes: 6 %
Neutrophils Absolute: 5.8 10*3/uL (ref 1.4–7.0)
Neutrophils: 71 %
Platelets: 275 10*3/uL (ref 150–450)
RBC: 5.34 x10E6/uL — ABNORMAL HIGH (ref 3.77–5.28)
RDW: 14.4 % (ref 11.7–15.4)
WBC: 8.1 10*3/uL (ref 3.4–10.8)

## 2021-06-30 LAB — B12 AND FOLATE PANEL
Folate: 5.7 ng/mL (ref >3.0)
Vitamin B-12: 476 pg/mL (ref 232–1245)

## 2021-06-30 LAB — VITAMIN D 25 HYDROXY (VIT D DEFICIENCY, FRACTURES): Vit D, 25-Hydroxy: 16.9 ng/mL — ABNORMAL LOW (ref 30.0–100.0)

## 2021-06-30 LAB — TSH+FREE T4
Free T4: 1.13 ng/dL (ref 0.82–1.77)
TSH: 1.96 u[IU]/mL (ref 0.450–4.500)

## 2021-07-02 NOTE — Progress Notes (Signed)
I have reviewed the lab results. There are no critically abnormal values requiring immediate intervention but there are some abnormals that will be discussed at the next office visit.  

## 2021-07-05 ENCOUNTER — Encounter: Payer: Self-pay | Admitting: Nurse Practitioner

## 2021-07-07 ENCOUNTER — Ambulatory Visit: Payer: BLUE CROSS/BLUE SHIELD | Admitting: Nurse Practitioner

## 2021-07-22 ENCOUNTER — Ambulatory Visit: Payer: Self-pay | Admitting: Nurse Practitioner

## 2021-07-26 ENCOUNTER — Ambulatory Visit (INDEPENDENT_AMBULATORY_CARE_PROVIDER_SITE_OTHER): Payer: BLUE CROSS/BLUE SHIELD | Admitting: Nurse Practitioner

## 2021-07-26 ENCOUNTER — Encounter: Payer: Self-pay | Admitting: Nurse Practitioner

## 2021-07-26 VITALS — BP 115/75 | HR 95 | Temp 98.4°F | Resp 16 | Ht 64.0 in | Wt 262.8 lb

## 2021-07-26 DIAGNOSIS — E661 Drug-induced obesity: Secondary | ICD-10-CM

## 2021-07-26 DIAGNOSIS — Z6841 Body Mass Index (BMI) 40.0 and over, adult: Secondary | ICD-10-CM

## 2021-07-26 DIAGNOSIS — E559 Vitamin D deficiency, unspecified: Secondary | ICD-10-CM | POA: Diagnosis not present

## 2021-07-26 DIAGNOSIS — R7301 Impaired fasting glucose: Secondary | ICD-10-CM

## 2021-07-26 DIAGNOSIS — R7303 Prediabetes: Secondary | ICD-10-CM | POA: Diagnosis not present

## 2021-07-26 DIAGNOSIS — E538 Deficiency of other specified B group vitamins: Secondary | ICD-10-CM | POA: Diagnosis not present

## 2021-07-26 MED ORDER — PHENTERMINE HCL 37.5 MG PO TABS: 37.5000 mg | ORAL_TABLET | Freq: Every day | ORAL | 0 refills | Status: DC

## 2021-07-26 MED ORDER — VITAMIN D (ERGOCALCIFEROL) 1.25 MG (50000 UNIT) PO CAPS: 50000.0000 [IU] | ORAL_CAPSULE | ORAL | 1 refills | Status: AC

## 2021-07-26 MED ORDER — METFORMIN HCL 500 MG PO TABS: 500.0000 mg | ORAL_TABLET | Freq: Two times a day (BID) | ORAL | 1 refills | Status: AC

## 2021-07-26 NOTE — Progress Notes (Signed)
[facility] ?[address] ?[place], [place] [address] ? ?Internal MEDICINE  ?Office Visit Note ? ?Patient Name: Ashlee Wilson ? [id number]/1987  ?[id number] ? ?Date of Service: 07/26/2021 ? ?Chief Complaint  ?Patient presents with  ? Follow-up  ? Anxiety  ? Weight Loss  ? ? ?HPI ?Ashlee Wilson presents for follow-up visit for weight loss, anxiety and to discuss her lab results.  At her previous office visit she established care and had recently been started on metformin for prediabetes and weight loss and phentermine to also help as a weight loss aid.  These medications were continued with the agreement that she would come in on a monthly basis to evaluate the process.  She has lost approximately 3 pounds since her previous office visit and she has noticed that her appetite is decreasing.  She does vary whether she takes a half a tablet or whole tablet of phentermine which is acceptable she ?Initially her vital signs showed slight tachycardia and elevated blood pressure but these resolved when her blood pressure and heart rate were rechecked. ?Labs were reviewed with the patient: Her iron panel was normal, B12 and folate were also normal.  Her cholesterol levels were normal.  Thyroid levels were normal.  Her metabolic panel, liver function and kidney function were all normal.  Her CBC was grossly normal with a slightly elevated RBC.  Her vitamin D level was significantly low at 16.9. ?Patient is interested in B12 injections and lipozene injections which she used to get at a weight loss clinic. ? ? ?Current Medication: ?Outpatient Encounter Medications as of 07/26/2021  ?Medication Sig  ? Vitamin D, Ergocalciferol, (DRISDOL) 1.25 MG (50000 UNIT) CAPS capsule Take 1 capsule (50,000 Units total) by mouth every 7 (seven) days.  ? [DISCONTINUED] metFORMIN (GLUCOPHAGE) 500 MG tablet Take 500 mg by mouth 2 (two) times daily.  ? [DISCONTINUED] phentermine (ADIPEX-P) 37.5 MG tablet Take 1 tablet (37.5 mg total) by mouth daily  before breakfast. May take 1/2 tablet instead of whole tablet id desired,  ? metFORMIN (GLUCOPHAGE) 500 MG tablet Take 1 tablet (500 mg total) by mouth 2 (two) times daily with a meal.  ? phentermine (ADIPEX-P) 37.5 MG tablet Take 1 tablet (37.5 mg total) by mouth daily before breakfast.  ? ?No facility-administered encounter medications on file as of 07/26/2021.  ? ? ?Surgical History: ?Past Surgical History:  ?Procedure Laterality Date  ? CESAREAN SECTION N/A 01/04/2016  ? Procedure: CESAREAN SECTION;  Surgeon: Conard NovakStephen D Jackson, MD;  Location: [facility] ORS;  Service: Obstetrics;  Laterality: N/A;  ? CESAREAN SECTION WITH BILATERAL TUBAL LIGATION Bilateral 09/16/2020  ? Procedure: REPEAT CESAREAN SECTION WITH BILATERAL TUBAL LIGATION;  Surgeon: Schermerhorn, Ihor Austinhomas J, MD;  Location: [facility] ORS;  Service: Obstetrics;  Laterality: Bilateral;  ? GALLBLADDER SURGERY  2014  ? LAPAROSCOPIC CHOLECYSTECTOMY N/A 2013  ? MOUTH SURGERY N/A 2003-07  ? lower teeth removed, manually pulled adult teeth up over several years  ? ? ?Medical History: ?Past Medical History:  ?Diagnosis Date  ? Anxiety 2012  ? non longer on meds  ? PCOS (polycystic ovarian syndrome)   ? ? ?Family History: ?Family History  ?Problem Relation Age of Onset  ? Asthma Mother   ? Kidney disease Father   ? Alcohol abuse Father   ? Hyperlipidemia Father   ? Prostate cancer Father   ? Skin cancer Father   ? ? ?Social History  ? ?Socioeconomic History  ? Marital status: Married  ?  Spouse name: Ashlee Wilson  ?  Number of children: Not on file  ? Years of education: Not on file  ? Highest education level: Not on file  ?Occupational History  ? Not on file  ?Tobacco Use  ? Smoking status: Former  ?  Types: Cigarettes  ?  Quit date: 04/16/2007  ?  Years since quitting: 14.2  ? Smokeless tobacco: Never  ?Vaping Use  ? Vaping Use: Never used  ?Substance and Sexual Activity  ? Alcohol use: No  ?  Alcohol/week: 0.0 standard drinks  ? Drug use: No  ? Sexual activity: Yes  ?  Birth  control/protection: Surgical  ?Other Topics Concern  ? Not on file  ?Social History Narrative  ? Not on file  ? ?Social Determinants of Health  ? ?Financial Resource Strain: Not on file  ?Food Insecurity: Not on file  ?Transportation Needs: Not on file  ?Physical Activity: Not on file  ?Stress: Not on file  ?Social Connections: Not on file  ?Intimate Partner Violence: Not on file  ? ? ? ? ?Review of Systems  ?Constitutional:  Negative for chills, fatigue and unexpected weight change.  ?HENT:  Negative for congestion, rhinorrhea, sneezing and sore throat.   ?Eyes:  Negative for redness.  ?Respiratory:  Negative for cough, chest tightness and shortness of breath.   ?Cardiovascular:  Negative for chest pain and palpitations.  ?Gastrointestinal:  Negative for abdominal pain, constipation, diarrhea, nausea and vomiting.  ?Genitourinary:  Negative for dysuria and frequency.  ?Musculoskeletal:  Negative for arthralgias, back pain, joint swelling and neck pain.  ?Skin:  Negative for rash.  ?Neurological: Negative.  Negative for tremors and numbness.  ?Hematological:  Negative for adenopathy. Does not bruise/bleed easily.  ?Psychiatric/Behavioral:  Negative for behavioral problems (Depression), sleep disturbance and suicidal ideas. The patient is not nervous/anxious.   ? ?Vital Signs: ?BP 115/75   Pulse 95   Temp 98.4 ?F (36.9 ?C)   Resp 16   Ht 5\' 4"  (1.626 m)   Wt 262 lb 12.8 oz (119.2 kg)   SpO2 98%   BMI 45.11 kg/m?  ? ? ?Physical Exam ?Vitals reviewed.  ?Constitutional:   ?   General: She is not in acute distress. ?   Appearance: Normal appearance. She is obese. She is not ill-appearing.  ?HENT:  ?   Head: Normocephalic and atraumatic.  ?Eyes:  ?   Pupils: Pupils are equal, round, and reactive to light.  ?Cardiovascular:  ?   Rate and Rhythm: Normal rate and regular rhythm.  ?Pulmonary:  ?   Effort: Pulmonary effort is normal. No respiratory distress.  ?Neurological:  ?   Mental Status: She is alert and oriented  to person, place, and time.  ?Psychiatric:     ?   Mood and Affect: Mood normal.     ?   Behavior: Behavior normal.  ? ? ? ? ? ?Assessment/Plan: ?1. Prediabetes ?A1c was prediabetic when it was checked last at 6.3 a month ago.  Patient is taking a combination of metformin and phentermine to help with weight loss this will also help control her A1c.  Patient has also been focusing on healthy diet modifications that will be good for her glucose levels as well as for weight loss.  We will repeat her A1c in a couple of months ?- metFORMIN (GLUCOPHAGE) 500 MG tablet; Take 1 tablet (500 mg total) by mouth 2 (two) times daily with a meal.  Dispense: 180 tablet; Refill: 1 ? ?2. Vitamin D deficiency ?Vitamin D level was severely  low on her labs, prescription vitamin D 50,000 unit capsule prescribed ?- Vitamin D, Ergocalciferol, (DRISDOL) 1.25 MG (50000 UNIT) CAPS capsule; Take 1 capsule (50,000 Units total) by mouth every 7 (seven) days.  Dispense: 12 capsule; Refill: 1 ? ?3. Class 3 drug-induced obesity with serious comorbidity and body mass index (BMI) of 45.0 to 49.9 in adult [facility]) ?Patient is on a combination of phentermine and metformin to help with weight loss.  She has lost 3 to 5 pounds since her previous office visit.  Follow-up in 4 weeks ?- phentermine (ADIPEX-P) 37.5 MG tablet; Take 1 tablet (37.5 mg total) by mouth daily before breakfast.  Dispense: 30 tablet; Refill: 0 ?- metFORMIN (GLUCOPHAGE) 500 MG tablet; Take 1 tablet (500 mg total) by mouth 2 (two) times daily with a meal.  Dispense: 180 tablet; Refill: 1 she had at ? ? ?General Counseling: Araly verbalizes understanding of the findings of todays visit and agrees with plan of treatment. I have discussed any further diagnostic evaluation that may be needed or ordered today. We also reviewed her medications today. she has been encouraged to call the office with any questions or concerns that should arise related to todays visit. ? ? ? ?No orders of the  defined types were placed in this encounter. ? ? ?Meds ordered this encounter  ?Medications  ? Vitamin D, Ergocalciferol, (DRISDOL) 1.25 MG (50000 UNIT) CAPS capsule  ?  Sig: Take 1 capsule (50,000 Units total) by mou

## 2021-08-02 ENCOUNTER — Encounter: Payer: Self-pay | Admitting: Nurse Practitioner

## 2021-08-23 ENCOUNTER — Ambulatory Visit (INDEPENDENT_AMBULATORY_CARE_PROVIDER_SITE_OTHER): Payer: BLUE CROSS/BLUE SHIELD | Admitting: Nurse Practitioner

## 2021-08-23 ENCOUNTER — Encounter: Payer: Self-pay | Admitting: Nurse Practitioner

## 2021-08-23 VITALS — BP 124/85 | HR 96 | Temp 98.4°F | Resp 16 | Ht 64.0 in | Wt 263.8 lb

## 2021-08-23 DIAGNOSIS — T781XXA Other adverse food reactions, not elsewhere classified, initial encounter: Secondary | ICD-10-CM

## 2021-08-23 DIAGNOSIS — E538 Deficiency of other specified B group vitamins: Secondary | ICD-10-CM | POA: Diagnosis not present

## 2021-08-23 DIAGNOSIS — E661 Drug-induced obesity: Secondary | ICD-10-CM | POA: Diagnosis not present

## 2021-08-23 DIAGNOSIS — Z91018 Allergy to other foods: Secondary | ICD-10-CM | POA: Diagnosis not present

## 2021-08-23 DIAGNOSIS — Z6841 Body Mass Index (BMI) 40.0 and over, adult: Secondary | ICD-10-CM

## 2021-08-23 DIAGNOSIS — R7303 Prediabetes: Secondary | ICD-10-CM

## 2021-08-23 MED ORDER — CYANOCOBALAMIN 1000 MCG/ML IJ SOLN: 1000.0000 ug | Freq: Once | INTRAMUSCULAR | Status: AC

## 2021-08-23 MED ORDER — EPINEPHRINE 0.3 MG/0.3ML IJ SOAJ: 0.3000 mg | INTRAMUSCULAR | 2 refills | Status: AC | PRN

## 2021-08-23 MED ORDER — PHENTERMINE HCL 37.5 MG PO TABS: 37.5000 mg | ORAL_TABLET | Freq: Every day | ORAL | 0 refills | Status: DC

## 2021-08-23 NOTE — Progress Notes (Signed)
[facility] [address] [place], [place] [address]  Internal MEDICINE  Office Visit Note  Patient Name: Ashlee Wilson  [id number] 26, 1987  [id number]  Date of Service: 08/23/2021  Chief Complaint  Patient presents with   Follow-up   Anxiety   Weight Loss    HPI Ashlee SchoolGwyneth presents for a follow-up visit for weight loss management, anxiety, food allergies.  Blood pressure and other vital signs are stable and within normal limits.  Patient reports having a walnut allergy and reports having had allergic reactions to other foods but she is not sure which foods.  She is requesting to be evaluated for food allergies. She was previously being seen at a bariatric clinic that provided B12 injections with Lipozene injections.  She is requesting to do lipozene injections here if possible which will need to be discussed with Dr. Beverely RisenFozia Khan.    Current Medication: Outpatient Encounter Medications as of 08/23/2021  Medication Sig   EPINEPHrine 0.3 mg/0.3 mL IJ SOAJ injection Inject 0.3 mg into the muscle as needed for anaphylaxis.   metFORMIN (GLUCOPHAGE) 500 MG tablet Take 1 tablet (500 mg total) by mouth 2 (two) times daily with a meal.   Vitamin D, Ergocalciferol, (DRISDOL) 1.25 MG (50000 UNIT) CAPS capsule Take 1 capsule (50,000 Units total) by mouth every 7 (seven) days.   [DISCONTINUED] phentermine (ADIPEX-P) 37.5 MG tablet Take 1 tablet (37.5 mg total) by mouth daily before breakfast.   phentermine (ADIPEX-P) 37.5 MG tablet Take 1 tablet (37.5 mg total) by mouth daily before breakfast.   Facility-Administered Encounter Medications as of 08/23/2021  Medication   cyanocobalamin ((VITAMIN B-12)) injection 1,000 mcg    Surgical History: Past Surgical History:  Procedure Laterality Date   CESAREAN SECTION N/A 01/04/2016   Procedure: CESAREAN SECTION;  Surgeon: Ashlee NovakStephen D Jackson, Ashlee Wilson;  Location: [facility] ORS;  Service: Obstetrics;  Laterality: N/A;   CESAREAN SECTION WITH BILATERAL TUBAL  LIGATION Bilateral 09/16/2020   Procedure: REPEAT CESAREAN SECTION WITH BILATERAL TUBAL LIGATION;  Surgeon: Ashlee Wilson, Ashlee Wilson, Ashlee Wilson;  Location: [facility] ORS;  Service: Obstetrics;  Laterality: Bilateral;   GALLBLADDER SURGERY  2014   LAPAROSCOPIC CHOLECYSTECTOMY N/A 2013   MOUTH SURGERY N/A 2003-07   lower teeth removed, manually pulled adult teeth up over several years    Medical History: Past Medical History:  Diagnosis Date   Anxiety 2012   non longer on meds   PCOS (polycystic ovarian syndrome)     Family History: Family History  Problem Relation Age of Onset   Asthma Mother    Kidney disease Father    Alcohol abuse Father    Hyperlipidemia Father    Prostate cancer Father    Skin cancer Father     Social History   Socioeconomic History   Marital status: Married    Spouse name: Ashlee Wilson   Number of children: Not on file   Years of education: Not on file   Highest education level: Not on file  Occupational History   Not on file  Tobacco Use   Smoking status: Former    Types: Cigarettes    Quit date: 04/16/2007    Years since quitting: 14.5   Smokeless tobacco: Never  Vaping Use   Vaping Use: Never used  Substance and Sexual Activity   Alcohol use: Yes    Comment: occ.   Drug use: No   Sexual activity: Yes    Birth control/protection: Surgical  Other Topics Concern   Not on file  Social History  Narrative   Not on file   Social Determinants of Health   Financial Resource Strain: Not on file  Food Insecurity: Not on file  Transportation Needs: Not on file  Physical Activity: Not on file  Stress: Not on file  Social Connections: Not on file  Intimate Partner Violence: Not on file      Review of Systems  Constitutional: Negative.  Negative for chills, fatigue and unexpected weight change.  HENT:  Negative for congestion, rhinorrhea, sneezing and sore throat.   Respiratory: Negative.  Negative for cough, chest tightness, shortness of breath and wheezing.    Cardiovascular: Negative.  Negative for chest pain and palpitations.  Gastrointestinal: Negative.  Negative for abdominal pain, constipation, diarrhea, nausea and vomiting.  Musculoskeletal:  Negative for arthralgias, back pain, joint swelling and neck pain.  Skin:  Negative for rash.  Psychiatric/Behavioral:  Negative for behavioral problems (Depression), sleep disturbance and suicidal ideas. The patient is not nervous/anxious.     Vital Signs: BP 124/85   Pulse 96   Temp 98.4 F (36.9 C)   Resp 16   Ht 5\' 4"  (1.626 m)   Wt 263 lb 12.8 oz (119.7 kg)   SpO2 98%   BMI 45.28 kg/m    Physical Exam Vitals reviewed.  Constitutional:      General: She is not in acute distress.    Appearance: Normal appearance. She is obese. She is not ill-appearing.  HENT:     Head: Normocephalic and atraumatic.  Eyes:     Pupils: Pupils are equal, round, and reactive to light.  Cardiovascular:     Rate and Rhythm: Normal rate and regular rhythm.  Pulmonary:     Effort: Pulmonary effort is normal. No respiratory distress.  Neurological:     Mental Status: She is alert and oriented to person, place, and time.  Psychiatric:        Mood and Affect: Mood normal.        Behavior: Behavior normal.        Assessment/Plan: 1. Prediabetes Patient has prediabetes and PCOS, continues to take metformin to help control her A1c as well as to aid in weight loss  2. B12 deficiency B12 injection administered in office today - cyanocobalamin ((VITAMIN B-12)) injection 1,000 mcg  3. Walnut allergy Patient has a walnut allergy and is requesting labs to have additional food allergies tested.  She also has had an EpiPen at home due to having severe allergic reactions to unknown foods and needs a refill. - IgE Food w/Component Reflex II - EPINEPHrine 0.3 mg/0.3 mL IJ SOAJ injection; Inject 0.3 mg into the muscle as needed for anaphylaxis.  Dispense: 1 each; Refill: 2  4. Allergic reaction to food,  initial encounter Patient has a history of severe allergic reactions to foods.  She is not sure what foods she is allergic to except that she has had a reaction to walnuts in the past.  Patient is requesting to have labs done to evaluate for further food allergies and is also in need of a EpiPen refill - IgE Food w/Component Reflex II - EPINEPHrine 0.3 mg/0.3 mL IJ SOAJ injection; Inject 0.3 mg into the muscle as needed for anaphylaxis.  Dispense: 1 each; Refill: 2  5. Class 3 drug-induced obesity with serious comorbidity and body mass index (BMI) of 45.0 to 49.9 in adult [facility]) In continuing weight loss management with the patient, she was given a vitamin B12 injection today in the office and her prescription for  phentermine was continued for another month and she continues to take the metformin for prediabetes.  She did not have any significant weight loss since her previous office visit.  At her next office visit, we will discuss other options if she still has not had any significant weight loss. - cyanocobalamin ((VITAMIN B-12)) injection 1,000 mcg - phentermine (ADIPEX-P) 37.5 MG tablet; Take 1 tablet (37.5 mg total) by mouth daily before breakfast.  Dispense: 30 tablet; Refill: 0   General Counseling: Mc verbalizes understanding of the findings of todays visit and agrees with plan of treatment. I have discussed any further diagnostic evaluation that may be needed or ordered today. We also reviewed her medications today. she has been encouraged to call the office with any questions or concerns that should arise related to todays visit.    Orders Placed This Encounter  Procedures   IgE Food w/Component Reflex II    Meds ordered this encounter  Medications   cyanocobalamin ((VITAMIN B-12)) injection 1,000 mcg   EPINEPHrine 0.3 mg/0.3 mL IJ SOAJ injection    Sig: Inject 0.3 mg into the muscle as needed for anaphylaxis.    Dispense:  1 each    Refill:  2   phentermine (ADIPEX-P) 37.5  MG tablet    Sig: Take 1 tablet (37.5 mg total) by mouth daily before breakfast.    Dispense:  30 tablet    Refill:  0    Return in about 4 weeks (around 09/20/2021) for F/U, Weight loss, Lindzey Zent PCP.   Total time spent:30 Minutes Time spent includes review of chart, medications, test results, and follow up plan with the patient.   [place] Controlled Substance Database was reviewed by me.  This patient was seen by Sallyanne Kuster, Ashlee Wilson in collaboration with Dr. Beverely Risen as a part of collaborative care agreement.   Ashlee Birge R. Tedd Sias, Ashlee Wilson, Ashlee Wilson Internal medicine

## 2021-09-17 LAB — IGE FOOD W/COMPONENT REFLEX II
Allergen Corn, IgE: <0.1 kU/L
Clam IgE: <0.1 kU/L
Codfish IgE: <0.1 kU/L
F001-IgE Egg White: <0.1 kU/L
F002-IgE Milk: <0.1 kU/L
F017-IgE Hazelnut (Filbert): <0.1 kU/L
F018-IgE Brazil Nut: <0.1 kU/L
F020-IgE Almond: <0.1 kU/L
F202-IgE Cashew Nut: <0.1 kU/L
F203-IgE Pistachio Nut: <0.1 kU/L
F256-IgE Walnut: <0.1 kU/L
Macadamia Nut, IgE: <0.1 kU/L
Peanut, IgE: <0.1 kU/L
Pecan Nut IgE: <0.1 kU/L
Scallop IgE: <0.1 kU/L
Sesame Seed IgE: <0.1 kU/L
Shrimp IgE: <0.1 kU/L
Soybean IgE: <0.1 kU/L
Wheat IgE: <0.1 kU/L

## 2021-09-21 ENCOUNTER — Ambulatory Visit: Payer: BLUE CROSS/BLUE SHIELD | Admitting: Nurse Practitioner

## 2021-09-21 ENCOUNTER — Encounter: Payer: Self-pay | Admitting: Nurse Practitioner

## 2021-09-21 VITALS — BP 134/90 | HR 78 | Temp 98.6°F | Resp 16 | Ht 64.0 in | Wt 262.0 lb

## 2021-09-21 DIAGNOSIS — Z6841 Body Mass Index (BMI) 40.0 and over, adult: Secondary | ICD-10-CM

## 2021-09-21 DIAGNOSIS — R7303 Prediabetes: Secondary | ICD-10-CM

## 2021-09-21 DIAGNOSIS — E661 Drug-induced obesity: Secondary | ICD-10-CM

## 2021-09-21 NOTE — Progress Notes (Signed)
[facility] [address] [place], [place] [address]  Internal MEDICINE  Office Visit Note  Patient Name: Ashlee Wilson  [id number]  [id number]  Date of Service: 09/21/2021  Chief Complaint  Patient presents with   Follow-up   Weight Loss    HPI Ashlee Wilson presents for a follow up visit for weight loss management, PCOS, prediabetes and medication refill. The phentermine and B12 regimen has not been producing any weight loss for the patient and she has been  eating a healthy diet low in carbs and sugar and stays active with her children as well as managing to get exercise in a few times a week. She has not tried anything else other than metformin, phentermine, lipozene and B12. We do not do lipozene injections at the clinic so we need to find a suitable alternative that will benefit the patient as much as possible.     Current Medication: Outpatient Encounter Medications as of 09/21/2021  Medication Sig   EPINEPHrine 0.3 mg/0.3 mL IJ SOAJ injection Inject 0.3 mg into the muscle as needed for anaphylaxis.   metFORMIN (GLUCOPHAGE) 500 MG tablet Take 1 tablet (500 mg total) by mouth 2 (two) times daily with a meal.   Vitamin D, Ergocalciferol, (DRISDOL) 1.25 MG (50000 UNIT) CAPS capsule Take 1 capsule (50,000 Units total) by mouth every 7 (seven) days.   [DISCONTINUED] phentermine (ADIPEX-P) 37.5 MG tablet Take 1 tablet (37.5 mg total) by mouth daily before breakfast.   Facility-Administered Encounter Medications as of 09/21/2021  Medication   cyanocobalamin ((VITAMIN B-12)) injection 1,000 mcg    Surgical History: Past Surgical History:  Procedure Laterality Date   CESAREAN SECTION N/A 01/04/2016   Procedure: CESAREAN SECTION;  Surgeon: Conard Novak, MD;  Location: [facility] ORS;  Service: Obstetrics;  Laterality: N/A;   CESAREAN SECTION WITH BILATERAL TUBAL LIGATION Bilateral 09/16/2020   Procedure: REPEAT CESAREAN SECTION WITH BILATERAL TUBAL LIGATION;  Surgeon:  Suzy Bouchard, MD;  Location: [facility] ORS;  Service: Obstetrics;  Laterality: Bilateral;   GALLBLADDER SURGERY  2014   LAPAROSCOPIC CHOLECYSTECTOMY N/A 2013   MOUTH SURGERY N/A 2003-07   lower teeth removed, manually pulled adult teeth up over several years    Medical History: Past Medical History:  Diagnosis Date   Anxiety 2012   non longer on meds   PCOS (polycystic ovarian syndrome)     Family History: Family History  Problem Relation Age of Onset   Asthma Mother    Kidney disease Father    Alcohol abuse Father    Hyperlipidemia Father    Prostate cancer Father    Skin cancer Father     Social History   Socioeconomic History   Marital status: Married    Spouse name: [name]   Number of children: Not on file   Years of education: Not on file   Highest education level: Not on file  Occupational History   Not on file  Tobacco Use   Smoking status: Former    Types: Cigarettes    Quit date: 04/16/2007    Years since quitting: 14.5   Smokeless tobacco: Never  Vaping Use   Vaping Use: Never used  Substance and Sexual Activity   Alcohol use: Yes    Comment: occ.   Drug use: No   Sexual activity: Yes    Birth control/protection: Surgical  Other Topics Concern   Not on file  Social History Narrative   Not on file   Social Determinants of Health  Financial Resource Strain: Not on file  Food Insecurity: Not on file  Transportation Needs: Not on file  Physical Activity: Not on file  Stress: Not on file  Social Connections: Not on file  Intimate Partner Violence: Not on file      Review of Systems  Constitutional: Negative.  Negative for chills, fatigue and unexpected weight change.  HENT:  Negative for congestion, rhinorrhea, sneezing and sore throat.   Respiratory: Negative.  Negative for cough, chest tightness, shortness of breath and wheezing.   Cardiovascular: Negative.  Negative for chest pain and palpitations.  Gastrointestinal: Negative.   Negative for abdominal pain, constipation, diarrhea, nausea and vomiting.  Musculoskeletal:  Negative for arthralgias, back pain, joint swelling and neck pain.  Skin:  Negative for rash.  Psychiatric/Behavioral:  Negative for behavioral problems (Depression), sleep disturbance and suicidal ideas. The patient is not nervous/anxious.     Vital Signs: BP 134/90   Pulse 78   Temp 98.6 F (37 C)   Resp 16   Ht 5\' 4"  (1.626 m)   Wt 262 lb (118.8 kg)   SpO2 99%   BMI 44.97 kg/m    Physical Exam Vitals reviewed.  Constitutional:      General: She is not in acute distress.    Appearance: Normal appearance. She is obese. She is not ill-appearing.  HENT:     Head: Normocephalic and atraumatic.  Eyes:     Pupils: Pupils are equal, round, and reactive to light.  Cardiovascular:     Rate and Rhythm: Normal rate and regular rhythm.  Pulmonary:     Effort: Pulmonary effort is normal. No respiratory distress.  Neurological:     Mental Status: She is alert and oriented to person, place, and time.  Psychiatric:        Mood and Affect: Mood normal.        Behavior: Behavior normal.        Assessment/Plan: 1. Prediabetes Sample of ozempic provided to patient, and patient will continue metformin  2. Class 3 drug-induced obesity with serious comorbidity and body mass index (BMI) of 45.0 to 49.9 in adult [facility]) Sample of ozempic provided to patient, will follow up in 1 month and send in a prescription for ozempic then.    General Counseling: Ashlee Wilson verbalizes understanding of the findings of todays visit and agrees with plan of treatment. I have discussed any further diagnostic evaluation that may be needed or ordered today. We also reviewed her medications today. she has been encouraged to call the office with any questions or concerns that should arise related to todays visit.    No orders of the defined types were placed in this encounter.   No orders of the defined types were  placed in this encounter.   Return in about 1 month (around 10/21/2021) for F/U, Weight loss, Ashlee Wilson PCP.   Total time spent:20 Minutes Time spent includes review of chart, medications, test results, and follow up plan with the patient.   [place] Controlled Substance Database was reviewed by me.  This patient was seen by 10/23/2021, FNP-C in collaboration with Dr. Sallyanne Kuster as a part of collaborative care agreement.   Ashlee Wilson R. Beverely Risen, MSN, FNP-C Internal medicine

## 2021-09-22 ENCOUNTER — Encounter: Payer: Self-pay | Admitting: Nurse Practitioner

## 2021-10-17 ENCOUNTER — Encounter: Payer: Self-pay | Admitting: Nurse Practitioner

## 2021-10-18 ENCOUNTER — Ambulatory Visit: Payer: BLUE CROSS/BLUE SHIELD | Admitting: Nurse Practitioner

## 2021-10-18 ENCOUNTER — Encounter: Payer: Self-pay | Admitting: Nurse Practitioner

## 2021-10-18 VITALS — BP 135/86 | HR 97 | Temp 98.2°F | Resp 16 | Ht 64.0 in | Wt 261.2 lb

## 2021-10-18 DIAGNOSIS — Z6841 Body Mass Index (BMI) 40.0 and over, adult: Secondary | ICD-10-CM | POA: Diagnosis not present

## 2021-10-18 DIAGNOSIS — R7303 Prediabetes: Secondary | ICD-10-CM | POA: Diagnosis not present

## 2021-10-18 DIAGNOSIS — E538 Deficiency of other specified B group vitamins: Secondary | ICD-10-CM

## 2021-10-18 DIAGNOSIS — E661 Drug-induced obesity: Secondary | ICD-10-CM | POA: Diagnosis not present

## 2021-10-18 MED ORDER — CYANOCOBALAMIN 1000 MCG/ML IJ SOLN
1000.0000 ug | Freq: Once | INTRAMUSCULAR | Status: AC
  Administered 2021-10-18: 1000 ug via INTRAMUSCULAR

## 2021-10-18 MED ORDER — SEMAGLUTIDE-WEIGHT MANAGEMENT 0.5 MG/0.5ML ~~LOC~~ SOAJ: 0.5000 mg | SUBCUTANEOUS | 2 refills | Status: DC

## 2021-10-18 NOTE — Progress Notes (Unsigned)
[facility] [address] [place], [place] [address]  Internal MEDICINE  Office Visit Note  Patient Name: Ashlee Wilson  [id number]  [id number]  Date of Service: 10/18/2021  Chief Complaint  Patient presents with   Follow-up    Hungry all the time   Anxiety   Weight Loss   Fatigue    HPI Ashlee Wilson presents for follow-up visit for anxiety, fatigue and weight loss management.  She has completed the first 4 weeks of Ozempic and she has noticed that her appetite has decreased some but during the end of the week couple days before its time to take the next injection she is noticing that she is becoming hungrier and wanting to eat more.  She does notice that she has significant amount of nausea initially the first couple days after taking the injection but it is manageable and she has figured out how to work through it and she does want to continue to take the medication.  She has lost 1 pound since her previous office visit but she is only just started taking the medication in the past 4 weeks so she should see more of a result in the next 4 to 8 weeks. She also reports having increased fatigue but she also is at home all day with her kids.     Current Medication: Outpatient Encounter Medications as of 10/18/2021  Medication Sig   EPINEPHrine 0.3 mg/0.3 mL IJ SOAJ injection Inject 0.3 mg into the muscle as needed for anaphylaxis.   metFORMIN (GLUCOPHAGE) 500 MG tablet Take 1 tablet (500 mg total) by mouth 2 (two) times daily with a meal.   Semaglutide,0.25 or 0.5MG /DOS, 2 MG/3ML SOPN Inject 0.5 mg into the skin once a week.   Vitamin D, Ergocalciferol, (DRISDOL) 1.25 MG (50000 UNIT) CAPS capsule Take 1 capsule (50,000 Units total) by mouth every 7 (seven) days.   [DISCONTINUED] phentermine (ADIPEX-P) 37.5 MG tablet Take 1 tablet (37.5 mg total) by mouth daily before breakfast.   [DISCONTINUED] Semaglutide-Weight Management 0.5 MG/0.5ML SOAJ Inject 0.5 mg into the skin once a  week.   Facility-Administered Encounter Medications as of 10/18/2021  Medication   cyanocobalamin ((VITAMIN B-12)) injection 1,000 mcg   [COMPLETED] cyanocobalamin ((VITAMIN B-12)) injection 1,000 mcg    Surgical History: Past Surgical History:  Procedure Laterality Date   CESAREAN SECTION N/A 01/04/2016   Procedure: CESAREAN SECTION;  Surgeon: Conard Novak, MD;  Location: [facility] ORS;  Service: Obstetrics;  Laterality: N/A;   CESAREAN SECTION WITH BILATERAL TUBAL LIGATION Bilateral 09/16/2020   Procedure: REPEAT CESAREAN SECTION WITH BILATERAL TUBAL LIGATION;  Surgeon: Suzy Bouchard, MD;  Location: [facility] ORS;  Service: Obstetrics;  Laterality: Bilateral;   GALLBLADDER SURGERY  2014   LAPAROSCOPIC CHOLECYSTECTOMY N/A 2013   MOUTH SURGERY N/A 2003-07   lower teeth removed, manually pulled adult teeth up over several years    Medical History: Past Medical History:  Diagnosis Date   Anxiety 2012   non longer on meds   PCOS (polycystic ovarian syndrome)     Family History: Family History  Problem Relation Age of Onset   Asthma Mother    Kidney disease Father    Alcohol abuse Father    Hyperlipidemia Father    Prostate cancer Father    Skin cancer Father     Social History   Socioeconomic History   Marital status: Married    Spouse name: Ashlee Wilson   Number of children: Not on file   Years of  education: Not on file   Highest education level: Not on file  Occupational History   Not on file  Tobacco Use   Smoking status: Former    Types: Cigarettes    Quit date: 04/16/2007    Years since quitting: 14.5   Smokeless tobacco: Never  Vaping Use   Vaping Use: Never used  Substance and Sexual Activity   Alcohol use: Yes    Comment: occ.   Drug use: No   Sexual activity: Yes    Birth control/protection: Surgical  Other Topics Concern   Not on file  Social History Narrative   Not on file   Social Determinants of Health   Financial Resource Strain: Not on file   Food Insecurity: Not on file  Transportation Needs: Not on file  Physical Activity: Not on file  Stress: Not on file  Social Connections: Not on file  Intimate Partner Violence: Not on file      Review of Systems  Constitutional:  Negative for chills, fatigue and unexpected weight change.  HENT:  Negative for congestion, rhinorrhea, sneezing and sore throat.   Eyes:  Negative for redness.  Respiratory:  Negative for cough, chest tightness and shortness of breath.   Cardiovascular:  Negative for chest pain and palpitations.  Gastrointestinal:  Negative for abdominal pain, constipation, diarrhea, nausea and vomiting.  Genitourinary:  Negative for dysuria and frequency.  Musculoskeletal:  Negative for arthralgias, back pain, joint swelling and neck pain.  Skin:  Negative for rash.  Neurological: Negative.  Negative for tremors and numbness.  Hematological:  Negative for adenopathy. Does not bruise/bleed easily.  Psychiatric/Behavioral:  Negative for behavioral problems (Depression), sleep disturbance and suicidal ideas. The patient is not nervous/anxious.     Vital Signs: BP 135/86   Pulse 97   Temp 98.2 F (36.8 C)   Resp 16   Ht 5\' 4"  (1.626 m)   Wt 261 lb 3.2 oz (118.5 kg)   SpO2 97%   BMI 44.83 kg/m    Physical Exam Vitals reviewed.  Constitutional:      General: She is not in acute distress.    Appearance: Normal appearance. She is obese. She is not ill-appearing.  HENT:     Head: Normocephalic and atraumatic.  Eyes:     Pupils: Pupils are equal, round, and reactive to light.  Cardiovascular:     Rate and Rhythm: Normal rate and regular rhythm.  Pulmonary:     Effort: Pulmonary effort is normal. No respiratory distress.  Neurological:     Mental Status: She is alert and oriented to person, place, and time.  Psychiatric:        Mood and Affect: Mood normal.        Behavior: Behavior normal.        Assessment/Plan: 1. Prediabetes Patient is  prediabetic, she is currently on metformin, attempted to have insurance cover [phone] but they did not, prescription for Ozempic sent to pharmacy and we will follow-up in 4 weeks - Semaglutide,0.25 or 0.5MG /DOS, 2 MG/3ML SOPN; Inject 0.5 mg into the skin once a week.  Dispense: 3 mL; Refill: 1  2. B12 deficiency B12 injection administered in the office today - cyanocobalamin ((VITAMIN B-12)) injection 1,000 mcg  3. Class 3 drug-induced obesity with serious comorbidity and body mass index (BMI) of 45.0 to 49.9 in adult [facility]) Please see problem #1 - Semaglutide,0.25 or 0.5MG /DOS, 2 MG/3ML SOPN; Inject 0.5 mg into the skin once a week.  Dispense: 3 mL; Refill:  1   General Counseling: Dealva verbalizes understanding of the findings of todays visit and agrees with plan of treatment. I have discussed any further diagnostic evaluation that may be needed or ordered today. We also reviewed her medications today. she has been encouraged to call the office with any questions or concerns that should arise related to todays visit.    No orders of the defined types were placed in this encounter.   Meds ordered this encounter  Medications   cyanocobalamin ((VITAMIN B-12)) injection 1,000 mcg   DISCONTD: Semaglutide-Weight Management 0.5 MG/0.5ML SOAJ    Sig: Inject 0.5 mg into the skin once a week.    Dispense:  2 mL    Refill:  2    Dx code E66.01 BMI 44.83   Semaglutide,0.25 or 0.5MG /DOS, 2 MG/3ML SOPN    Sig: Inject 0.5 mg into the skin once a week.    Dispense:  3 mL    Refill:  1    Please discontinue previous dose of Wegovy or semaglutide and fill new prescription instead. Dx code R73.03, E66.01    Return in about 1 month (around 11/18/2021) for F/U, Weight loss, Greer Wainright PCP.   Total time spent:30 Minutes Time spent includes review of chart, medications, test results, and follow up plan with the patient.   [place] Controlled Substance Database was reviewed by me.  This patient was seen by  Sallyanne Kuster, FNP-C in collaboration with Dr. Beverely Risen as a part of collaborative care agreement.   Padraic Marinos R. Tedd Sias, MSN, FNP-C Internal medicine

## 2021-10-19 ENCOUNTER — Encounter: Payer: Self-pay | Admitting: Nurse Practitioner

## 2021-10-19 MED ORDER — SEMAGLUTIDE(0.25 OR 0.5MG/DOS) 2 MG/3ML ~~LOC~~ SOPN: 0.5000 mg | PEN_INJECTOR | SUBCUTANEOUS | 1 refills | Status: AC

## 2021-10-24 ENCOUNTER — Telehealth: Payer: Self-pay

## 2021-10-24 NOTE — Telephone Encounter (Signed)
PA for OZEMPIC 0.2 or 0.5 MG sent 10/24/21 @ 532 pm

## 2021-10-26 ENCOUNTER — Telehealth: Payer: Self-pay

## 2021-10-26 NOTE — Telephone Encounter (Signed)
Spoke to pt, informed her ozempic was denied. Pt will call her insurance and get a list of preferred meds then call is back.

## 2021-10-27 ENCOUNTER — Telehealth: Payer: Self-pay

## 2021-10-31 ENCOUNTER — Telehealth: Payer: Self-pay

## 2021-10-31 NOTE — Telephone Encounter (Signed)
PA for [facility] sent 10/31/21 @ 2:18 pm

## 2021-11-02 ENCOUNTER — Other Ambulatory Visit: Payer: BLUE CROSS/BLUE SHIELD | Admitting: Nurse Practitioner

## 2021-11-03 NOTE — Telephone Encounter (Signed)
Pt has sample of OZEMPIC in fridge to pick up

## 2021-11-05 ENCOUNTER — Telehealth: Payer: Self-pay

## 2021-11-05 NOTE — Telephone Encounter (Signed)
Called insurance and spoke to the PA dept and I faxed over the last office note to them where it states ppatient is prediabetic

## 2021-11-05 NOTE — Telephone Encounter (Signed)
Faxed paperwork to [facility] [place] pharmacy utilization management [phone]

## 2021-11-07 ENCOUNTER — Encounter: Payer: Self-pay | Admitting: Nurse Practitioner

## 2021-11-18 ENCOUNTER — Ambulatory Visit: Payer: BLUE CROSS/BLUE SHIELD | Admitting: Nurse Practitioner

## 2021-11-18 ENCOUNTER — Encounter: Payer: Self-pay | Admitting: Nurse Practitioner

## 2021-11-18 VITALS — BP 122/83 | HR 82 | Temp 98.4°F | Resp 16 | Ht 64.0 in | Wt 261.0 lb

## 2021-11-18 DIAGNOSIS — E661 Drug-induced obesity: Secondary | ICD-10-CM | POA: Diagnosis not present

## 2021-11-18 DIAGNOSIS — Z6841 Body Mass Index (BMI) 40.0 and over, adult: Secondary | ICD-10-CM

## 2021-11-18 DIAGNOSIS — R7303 Prediabetes: Secondary | ICD-10-CM | POA: Diagnosis not present

## 2021-11-18 DIAGNOSIS — E559 Vitamin D deficiency, unspecified: Secondary | ICD-10-CM

## 2021-11-18 LAB — POCT GLYCOSYLATED HEMOGLOBIN (HGB A1C): Hemoglobin A1C: 5.1 % (ref 4.0–5.6)

## 2021-11-18 NOTE — Progress Notes (Signed)
[facility] [address] [place], [place] [address]  Internal MEDICINE  Office Visit Note  Patient Name: Ashlee Wilson  [id number]  [id number]  Date of Service: 11/18/2021  Chief Complaint  Patient presents with   Follow-up   Medical Management of Chronic Issues    Weight management    Anxiety    HPI Ashlee Wilson presents for follow-up visit for prediabetes, anxiety, weight loss management and medication refill. Prediabetes --A1c was as high as 6.3, but has improved to 5.1 as of today.  PA for Ozempic was denied.  Was provided a sample of Ozempic and is finishing that. Weight loss management --Ozempic was denied by insurance, Reg[facility] Lutes is backordered.  Phentermine has not been helping.  Discussing alternative options.  While finishing the Ozempic sample, she has had no weight gain or weight loss.  Patient asking about newer medication called Imcivree.  She reports that her insurance that they will cover this med. Vitamin D deficiency --taking weekly prescription supplement, no issues      Current Medication: Outpatient Encounter Medications as of 11/18/2021  Medication Sig   EPINEPHrine 0.3 mg/0.3 mL IJ SOAJ injection Inject 0.3 mg into the muscle as needed for anaphylaxis.   metFORMIN (GLUCOPHAGE) 500 MG tablet Take 1 tablet (500 mg total) by mouth 2 (two) times daily with a meal.   Semaglutide,0.25 or 0.5MG /DOS, 2 MG/3ML SOPN Inject 0.5 mg into the skin once a week.   Vitamin D, Ergocalciferol, (DRISDOL) 1.25 MG (50000 UNIT) CAPS capsule Take 1 capsule (50,000 Units total) by mouth every 7 (seven) days.   Facility-Administered Encounter Medications as of 11/18/2021  Medication   cyanocobalamin ((VITAMIN B-12)) injection 1,000 mcg    Surgical History: Past Surgical History:  Procedure Laterality Date   CESAREAN SECTION N/A 01/04/2016   Procedure: CESAREAN SECTION;  Surgeon: Conard Novak, MD;  Location: [facility] ORS;  Service: Obstetrics;  Laterality: N/A;   CESAREAN  SECTION WITH BILATERAL TUBAL LIGATION Bilateral 09/16/2020   Procedure: REPEAT CESAREAN SECTION WITH BILATERAL TUBAL LIGATION;  Surgeon: Suzy Bouchard, MD;  Location: [facility] ORS;  Service: Obstetrics;  Laterality: Bilateral;   GALLBLADDER SURGERY  2014   LAPAROSCOPIC CHOLECYSTECTOMY N/A 2013   MOUTH SURGERY N/A 2003-07   lower teeth removed, manually pulled adult teeth up over several years    Medical History: Past Medical History:  Diagnosis Date   Anxiety 2012   non longer on meds   PCOS (polycystic ovarian syndrome)     Family History: Family History  Problem Relation Age of Onset   Asthma Mother    Kidney disease Father    Alcohol abuse Father    Hyperlipidemia Father    Prostate cancer Father    Skin cancer Father     Social History   Socioeconomic History   Marital status: Married    Spouse name: [name]   Number of children: Not on file   Years of education: Not on file   Highest education level: Not on file  Occupational History   Not on file  Tobacco Use   Smoking status: Former    Types: Cigarettes    Quit date: 04/16/2007    Years since quitting: 14.6   Smokeless tobacco: Never  Vaping Use   Vaping Use: Never used  Substance and Sexual Activity   Alcohol use: Yes    Comment: occ.   Drug use: No   Sexual activity: Yes    Birth control/protection: Surgical  Other Topics Concern  Not on file  Social History Narrative   Not on file   Social Determinants of Health   Financial Resource Strain: Not on file  Food Insecurity: Not on file  Transportation Needs: Not on file  Physical Activity: Not on file  Stress: Not on file  Social Connections: Not on file  Intimate Partner Violence: Not on file      Review of Systems  Constitutional:  Negative for chills, fatigue and unexpected weight change.  HENT:  Negative for congestion, rhinorrhea, sneezing and sore throat.   Eyes:  Negative for redness.  Respiratory:  Negative for cough, chest  tightness and shortness of breath.   Cardiovascular:  Negative for chest pain and palpitations.  Gastrointestinal:  Negative for abdominal pain, constipation, diarrhea, nausea and vomiting.  Genitourinary:  Negative for dysuria and frequency.  Musculoskeletal:  Negative for arthralgias, back pain, joint swelling and neck pain.  Skin:  Negative for rash.  Neurological: Negative.  Negative for tremors and numbness.  Hematological:  Negative for adenopathy. Does not bruise/bleed easily.  Psychiatric/Behavioral:  Negative for behavioral problems (Depression), sleep disturbance and suicidal ideas. The patient is not nervous/anxious.     Vital Signs: BP 122/83   Pulse 82   Temp 98.4 F (36.9 C)   Resp 16   Ht 5\' 4"  (1.626 m)   Wt 261 lb (118.4 kg)   SpO2 98%   BMI 44.80 kg/m    Physical Exam Vitals reviewed.  Constitutional:      General: She is not in acute distress.    Appearance: Normal appearance. She is obese. She is not ill-appearing.  HENT:     Head: Normocephalic and atraumatic.  Eyes:     Pupils: Pupils are equal, round, and reactive to light.  Cardiovascular:     Rate and Rhythm: Normal rate and regular rhythm.  Pulmonary:     Effort: Pulmonary effort is normal. No respiratory distress.  Neurological:     Mental Status: She is alert and oriented to person, place, and time.  Psychiatric:        Mood and Affect: Mood normal.        Behavior: Behavior normal.        Assessment/Plan: 1. Prediabetes A1c significantly improved and now in normal range at 5.1 - POCT HgB A1C  2. Vitamin D deficiency Taking weekly prescription vitamin D supplement  3. Class 3 drug-induced obesity with serious comorbidity and body mass index (BMI) of 45.0 to 49.9 in adult [facility]) Wegovy backordered, ozempic [place] denied, phentermine not effective, will schedule consult with [facility] for obesity and weight loss management to determine best alternative option for the  patient   General Counseling: Ashlee Wilson verbalizes understanding of the findings of todays visit and agrees with plan of treatment. I have discussed any further diagnostic evaluation that may be needed or ordered today. We also reviewed her medications today. she has been encouraged to call the office with any questions or concerns that should arise related to todays visit.    Orders Placed This Encounter  Procedures   POCT HgB A1C    No orders of the defined types were placed in this encounter.   Return for consult with [facility] for obesity and weight loss management. and has CPE w/me 9/12.   Total time spent:30 Minutes Time spent includes review of chart, medications, test results, and follow up plan with the patient.   [place] Controlled Substance Database was reviewed by me.  This patient was  seen by Jonetta Osgood, FNP-C in collaboration with Dr. Clayborn Bigness as a part of collaborative care agreement.   Sejla Marzano R. Valetta Fuller, MSN, FNP-C Internal medicine

## 2021-11-30 ENCOUNTER — Ambulatory Visit: Payer: BLUE CROSS/BLUE SHIELD | Admitting: Internal Medicine

## 2021-12-15 ENCOUNTER — Telehealth: Payer: Self-pay | Admitting: Nurse Practitioner

## 2021-12-15 NOTE — Telephone Encounter (Signed)
Left vm and sent mychart message to confirm 12/21/21 appointment-Ashlee Wilson 

## 2021-12-21 ENCOUNTER — Other Ambulatory Visit: Payer: BLUE CROSS/BLUE SHIELD | Admitting: Nurse Practitioner

## 2022-01-09 ENCOUNTER — Encounter: Payer: Self-pay | Admitting: Nurse Practitioner

## 2022-05-12 DIAGNOSIS — Z419 Encounter for procedure for purposes other than remedying health state, unspecified: Secondary | ICD-10-CM | POA: Diagnosis not present

## 2022-06-10 DIAGNOSIS — Z419 Encounter for procedure for purposes other than remedying health state, unspecified: Secondary | ICD-10-CM | POA: Diagnosis not present

## 2024-05-28 ENCOUNTER — Ambulatory Visit: Payer: Self-pay | Admitting: Nurse Practitioner
